# Patient Record
Sex: Male | Born: 1955
Health system: Southern US, Community
[De-identification: ages and names within clinical notes are randomized; demographics above are authoritative.]

## PROBLEM LIST (undated history)

## (undated) DIAGNOSIS — E7800 Pure hypercholesterolemia, unspecified: Secondary | ICD-10-CM

## (undated) DIAGNOSIS — Z970 Presence of artificial eye: Secondary | ICD-10-CM

## (undated) DIAGNOSIS — I10 Essential (primary) hypertension: Secondary | ICD-10-CM

## (undated) HISTORY — DX: Presence of artificial eye: Z97.0

---
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
## 2006-03-23 ENCOUNTER — Ambulatory Visit: Payer: Self-pay | Admitting: Gastroenterology

## 2006-03-30 ENCOUNTER — Ambulatory Visit (HOSPITAL_COMMUNITY): Admission: RE | Admit: 2006-03-30 | Discharge: 2006-03-30 | Payer: Self-pay | Admitting: Gastroenterology

## 2006-03-30 ENCOUNTER — Ambulatory Visit: Payer: Self-pay | Admitting: Gastroenterology

## 2006-03-30 ENCOUNTER — Encounter (INDEPENDENT_AMBULATORY_CARE_PROVIDER_SITE_OTHER): Payer: Self-pay | Admitting: Specialist

## 2008-07-02 ENCOUNTER — Emergency Department (HOSPITAL_COMMUNITY): Admission: EM | Admit: 2008-07-02 | Discharge: 2008-07-02 | Payer: Self-pay | Admitting: Emergency Medicine

## 2010-09-30 LAB — BASIC METABOLIC PANEL
CO2: 25 mEq/L (ref 19–32)
Calcium: 9.1 mg/dL (ref 8.4–10.5)
Chloride: 107 mEq/L (ref 96–112)
GFR calc Af Amer: >60 mL/min (ref >60)
Potassium: 4.5 mEq/L (ref 3.5–5.1)
Sodium: 138 mEq/L (ref 135–145)

## 2010-09-30 LAB — CBC
Hemoglobin: 14.1 g/dL (ref 13.0–17.0)
MCHC: 32.2 g/dL (ref 30.0–36.0)
RBC: 6.02 MIL/uL — ABNORMAL HIGH (ref 4.22–5.81)

## 2010-09-30 LAB — POCT CARDIAC MARKERS
CKMB, poc: <1 ng/mL — ABNORMAL LOW (ref 1.0–8.0)
CKMB, poc: <1 ng/mL — ABNORMAL LOW (ref 1.0–8.0)
Myoglobin, poc: 96.8 ng/mL (ref 12–200)
Troponin i, poc: <0.05 ng/mL (ref 0.00–0.09)

## 2010-09-30 LAB — DIFFERENTIAL
Basophils Relative: 0 % (ref 0–1)
Monocytes Absolute: 0.2 10*3/uL (ref 0.1–1.0)
Monocytes Relative: 4 % (ref 3–12)
Neutro Abs: 4.9 10*3/uL (ref 1.7–7.7)

## 2010-11-01 NOTE — Op Note (Signed)
Spencer Carter, Spencer Carter                   ACCOUNT NO.:  [id number]   MEDICAL RECORD NO.:  [id number]          PATIENT TYPE:  AMB   LOCATION:  DAY                           FACILITY:  [facility]   PHYSICIAN:  Kassie Mends, M.D.      DATE OF BIRTH:  [birth year]   DATE OF PROCEDURE:  03/30/2006  DATE OF DISCHARGE:                                 OPERATIVE REPORT   PROCEDURE:  Colonoscopy with cold forceps polypectomy and snare polypectomy.   REFERRING PHYSICIAN:  Assunta Found, MD.   INDICATION FOR EXAM:  Mr. Mault is a 55 year old male who presented with  bloating.  He also presents for average risk colon cancer screening.   FINDINGS:  1. 6 mm pedunculated transverse colon polyp removed via snare cautery      (200/25).  2. 3 mm sessile rectal polyp removed via cold forceps.  3. Otherwise no masses, inflammatory changes or diverticula evident.  4. Normal retroflex view of the rectum.   RECOMMENDATIONS:  1. If polyps adenomatous, then next screening colonoscopy in five years.      If polyps are adenomatous, then his siblings and children should begin      colon cancer screening at age 3, and then have one every 5 years  2. No aspirin or anti-inflammatory drugs or anticoagulation for seven      days.  3. Will call with biopsy results.  4. Follow up with Dr. Kassie Mends in two months for bloating.  5. Handout given on high-fiber diet and polyps.   MEDICATIONS:  1. Demerol 100 mg IV.  2. Versed 5 mg IV.   PROCEDURE TECHNIQUE:  Physical exam was performed and informed consent was  obtained from the patient after explaining the benefits, risks and  alternatives to the procedure.  The patient was connected to the monitor and  placed in left lateral position.  Continuous oxygen was provided by nasal  cannula and IV medicine administered through an indwelling cannula.  After  administration of sedation and rectal exam, the patient's rectum was  intubated.  The scope was passed under direct  visualization to the cecum.  The scope was  subsequently moved slowly by carefully examining color, texture, anatomy and  integrity of the mucosa on the way out.  The patient was recovered in  endoscopy suite discharged home in satisfactory condition.      Kassie Mends, M.D.  Electronically Signed     SM/MEDQ  D:  03/31/2006  T:  04/01/2006  Job:  [id number]   cc:   Corrie Mckusick, M.D.  Fax: [phone]

## 2010-11-01 NOTE — Consult Note (Signed)
NAMEREBECCA, Spencer Carter                   ACCOUNT NO.:  [id number]   MEDICAL RECORD NO.:  [id number]          PATIENT TYPE:  AMB   LOCATION:                                FACILITY:  [facility]   PHYSICIAN:  Kassie Mends, M.D.      DATE OF BIRTH:  06-08-1956   DATE OF CONSULTATION:  03/23/2006  DATE OF DISCHARGE:                                   CONSULTATION   REFERRING PHYSICIAN:  Corrie Mckusick, M.D.   REASON FOR CONSULTATION:  Abdominal bloating.   HISTORY OF PRESENT ILLNESS:  Spencer Carter is a 55 year old male who has had  bloating and gas which have been moderate for the past month. It is not  related to any particular foods. He does consume milk with cereal and during  the day chews two to three pieces of sugar-free gum. He has 1 to 2 formed  bowel movements a day. He may also have cheese on his eggs in the morning.  He denies any consumption of peppermint, wintergreen or spearmint candy. He  can be gassy at times. He has been burping a lot for the last couple of  months. He denies any change in his diet. He occasionally has a Dr. Reino Kent.  He drinks 1 to 2 glasses of sweet tea a day. Primarily drinks water. He  denies any nausea, vomiting, difficulty swallowing, weight loss, black tarry  stools or indigestion. He has heartburn once a week and uses omeprazole as  needed for that. He has lower abdominal pain and bloating. He may experience  some upper quadrant pain as well. He denies any weight loss.   PAST MEDICAL HISTORY:  1. Hypertension.  2. Sciatica.   PAST SURGICAL HISTORY:  Eye surgery.   ALLERGIES:  No known drug allergies.   MEDICATIONS:  1. Omeprazole 20 mg 1 or 2 times a day as needed.  2. Hydrocodone/APAP 5/500 one every 4 to 6 hours as needed.  3. Hydrochlorothiazide 1 daily, currently not using.   FAMILY HISTORY:  He has no family history of colon cancer or colon polyps.   SOCIAL HISTORY:  He is married and has two children, ages 53 and 48. 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 He denies any tobacco or alcohol  use.   REVIEW OF SYSTEMS:  Per the HPI. Otherwise, all systems are negative.   PHYSICAL EXAMINATION:  Weight was 168.5 pounds, height 5 foot 8 inches, BMI  25.5 (slightly overweight). Temperature 98.1, blood pressure 128/84, pulse  84.  GENERAL:  He is in no apparent distress, alert and oriented x4. He is in no  apparent distress.  HEENT:  Exam is atraumatic and normocephalic. Pupils are equal, round, and  reactive to light. Mouth:  No oral lesions. Posterior pharynx without  erythema or exudate.  NECK:  Full range of motion. No lymphadenopathy.  LUNGS:  Clear to auscultation bilaterally.  CARDIOVASCULAR EXAM:  Regular rate and rhythm. No murmur. Normal S1 and S2.  ABDOMEN:  Bowel sounds are present. Soft, nontender, nondistended. No  rebound or guarding.  No hepatosplenomegaly.  EXTREMITIES:  Without cyanosis, clubbing or edema.  NEUROLOGICAL:  He has no focal neurologic deficits.   LABORATORY DATA:  December 2006:  White count 4.6, hemoglobin 15.6,  platelets 268, total bilirubin 1.3, alkaline phosphatase 66, AST 26, ALT 22,  albumin 4.7, TSH 3.803; March 2007:  Creatinine 1.4, total bilirubin 0.7,  alkaline phosphatase 61, AST 24, ALT 21, albumin 3.8.   ASSESSMENT:  The patient is a 55 year old male with abdominal bloating and  associated discomfort which is likely secondary to food intolerance. He has  a low likelihood of having irritable bowel or colon malignancy. He is  average risk for developing colon cancer. Thank you for allowing me to see  the patient in consultation. My recommendations follow.   RECOMMENDATIONS:  1. He should avoid lactose. He is given a list of foods that may cause      bloating and gas production in the colon.  2. He will be scheduled for colonoscopy the week of the 15th of October.      He will use the OsmoPrep.  3. He will follow with me as needed.   Please feel free to contact me, [phone],  with additional questions.      Kassie Mends, M.D.  Electronically Signed     SM/MEDQ  D:  03/23/2006  T:  03/25/2006  Job:  [id number]   cc:   Corrie Mckusick, M.D.  Fax: [phone]

## 2011-03-05 ENCOUNTER — Encounter: Payer: Self-pay | Admitting: Gastroenterology

## 2011-03-11 ENCOUNTER — Encounter: Payer: Self-pay | Admitting: Gastroenterology

## 2015-04-18 ENCOUNTER — Other Ambulatory Visit (HOSPITAL_COMMUNITY): Payer: Self-pay | Admitting: Internal Medicine

## 2015-04-27 ENCOUNTER — Other Ambulatory Visit (HOSPITAL_COMMUNITY): Payer: Self-pay | Admitting: Hematology & Oncology

## 2015-04-27 DIAGNOSIS — R590 Localized enlarged lymph nodes: Secondary | ICD-10-CM | POA: Insufficient documentation

## 2015-05-01 ENCOUNTER — Encounter (HOSPITAL_COMMUNITY): Payer: 59 | Attending: Oncology

## 2015-05-01 DIAGNOSIS — R599 Enlarged lymph nodes, unspecified: Secondary | ICD-10-CM | POA: Insufficient documentation

## 2015-05-01 DIAGNOSIS — Z0000 Encounter for general adult medical examination without abnormal findings: Secondary | ICD-10-CM | POA: Diagnosis not present

## 2015-05-01 DIAGNOSIS — R590 Localized enlarged lymph nodes: Secondary | ICD-10-CM | POA: Diagnosis not present

## 2015-05-01 DIAGNOSIS — K7689 Other specified diseases of liver: Secondary | ICD-10-CM | POA: Insufficient documentation

## 2015-05-01 LAB — COMPREHENSIVE METABOLIC PANEL
ALT: 17 U/L (ref 17–63)
ANION GAP: 5 (ref 5–15)
AST: 21 U/L (ref 15–41)
Albumin: 3.8 g/dL (ref 3.5–5.0)
Alkaline Phosphatase: 69 U/L (ref 38–126)
BUN: 23 mg/dL — ABNORMAL HIGH (ref 6–20)
CHLORIDE: 109 mmol/L (ref 101–111)
CO2: 26 mmol/L (ref 22–32)
CREATININE: 1.31 mg/dL — AB (ref 0.61–1.24)
Calcium: 8.8 mg/dL — ABNORMAL LOW (ref 8.9–10.3)
GFR calc non Af Amer: 58 mL/min — ABNORMAL LOW (ref >60)
Glucose, Bld: 96 mg/dL (ref 65–99)
Potassium: 4.1 mmol/L (ref 3.5–5.1)
SODIUM: 140 mmol/L (ref 135–145)
Total Bilirubin: 0.7 mg/dL (ref 0.3–1.2)
Total Protein: 7.2 g/dL (ref 6.5–8.1)

## 2015-05-01 LAB — CBC WITH DIFFERENTIAL/PLATELET
BASOS PCT: 1 %
Basophils Absolute: 0 10*3/uL (ref 0.0–0.1)
EOS ABS: 0.1 10*3/uL (ref 0.0–0.7)
EOS PCT: 4 %
HCT: 40.9 % (ref 39.0–52.0)
Hemoglobin: 12.8 g/dL — ABNORMAL LOW (ref 13.0–17.0)
LYMPHS ABS: 1.7 10*3/uL (ref 0.7–4.0)
Lymphocytes Relative: 47 %
MCH: 23.4 pg — AB (ref 26.0–34.0)
MCHC: 31.3 g/dL (ref 30.0–36.0)
MCV: 74.6 fL — ABNORMAL LOW (ref 78.0–100.0)
Monocytes Absolute: 0.4 10*3/uL (ref 0.1–1.0)
Monocytes Relative: 10 %
Neutro Abs: 1.4 10*3/uL — ABNORMAL LOW (ref 1.7–7.7)
Neutrophils Relative %: 38 %
PLATELETS: 190 10*3/uL (ref 150–400)
RBC: 5.48 MIL/uL (ref 4.22–5.81)
RDW: 15.5 % (ref 11.5–15.5)
WBC: 3.6 10*3/uL — AB (ref 4.0–10.5)

## 2015-05-01 LAB — LACTATE DEHYDROGENASE: LDH: 143 U/L (ref 98–192)

## 2015-05-01 LAB — SEDIMENTATION RATE: SED RATE: 10 mm/h (ref 0–16)

## 2015-05-01 NOTE — Progress Notes (Signed)
LABS DRAWN

## 2015-05-02 ENCOUNTER — Ambulatory Visit
Admission: RE | Admit: 2015-05-02 | Discharge: 2015-05-02 | Disposition: A | Discharge status: Home / Self Care | Payer: 59 | Source: Ambulatory Visit | Attending: Hematology & Oncology | Admitting: Hematology & Oncology

## 2015-05-02 DIAGNOSIS — R590 Localized enlarged lymph nodes: Secondary | ICD-10-CM | POA: Insufficient documentation

## 2015-05-02 DIAGNOSIS — Z0189 Encounter for other specified special examinations: Secondary | ICD-10-CM | POA: Insufficient documentation

## 2015-05-02 DIAGNOSIS — R599 Enlarged lymph nodes, unspecified: Secondary | ICD-10-CM | POA: Diagnosis present

## 2015-05-02 LAB — BETA 2 MICROGLOBULIN, SERUM: Beta-2 Microglobulin: 1.7 mg/L (ref 0.6–2.4)

## 2015-05-02 LAB — GLUCOSE, CAPILLARY: Glucose-Capillary: 79 mg/dL (ref 65–99)

## 2015-05-02 MED ORDER — FLUDEOXYGLUCOSE F - 18 (FDG) INJECTION
12.7100 | Freq: Once | INTRAVENOUS | Status: DC | PRN
  Administered 2015-05-02: 12.71 via INTRAVENOUS
  Filled 2015-05-02: qty 12.71

## 2015-05-09 ENCOUNTER — Encounter (HOSPITAL_BASED_OUTPATIENT_CLINIC_OR_DEPARTMENT_OTHER): Payer: 59 | Admitting: Hematology & Oncology

## 2015-05-09 ENCOUNTER — Encounter (HOSPITAL_COMMUNITY): Payer: Self-pay | Admitting: Hematology & Oncology

## 2015-05-09 ENCOUNTER — Encounter (HOSPITAL_BASED_OUTPATIENT_CLINIC_OR_DEPARTMENT_OTHER): Payer: 59

## 2015-05-09 VITALS — BP 140/90 | HR 74 | Temp 98.1°F | Resp 14 | Ht 66.5 in | Wt 176.0 lb

## 2015-05-09 DIAGNOSIS — R932 Abnormal findings on diagnostic imaging of liver and biliary tract: Secondary | ICD-10-CM | POA: Diagnosis not present

## 2015-05-09 DIAGNOSIS — K7689 Other specified diseases of liver: Secondary | ICD-10-CM | POA: Diagnosis not present

## 2015-05-09 DIAGNOSIS — K769 Liver disease, unspecified: Secondary | ICD-10-CM

## 2015-05-09 DIAGNOSIS — R590 Localized enlarged lymph nodes: Secondary | ICD-10-CM

## 2015-05-09 DIAGNOSIS — R599 Enlarged lymph nodes, unspecified: Secondary | ICD-10-CM

## 2015-05-09 DIAGNOSIS — Z0000 Encounter for general adult medical examination without abnormal findings: Secondary | ICD-10-CM

## 2015-05-09 DIAGNOSIS — Z23 Encounter for immunization: Secondary | ICD-10-CM | POA: Diagnosis not present

## 2015-05-09 MED ORDER — INFLUENZA VAC SPLIT QUAD 0.5 ML IM SUSY
0.5000 mL | PREFILLED_SYRINGE | Freq: Once | INTRAMUSCULAR | Status: AC
  Administered 2015-05-09: 0.5 mL via INTRAMUSCULAR

## 2015-05-09 MED ORDER — INFLUENZA VAC SPLIT QUAD 0.5 ML IM SUSY
PREFILLED_SYRINGE | INTRAMUSCULAR | Status: AC
  Filled 2015-05-09: qty 0.5

## 2015-05-09 NOTE — Progress Notes (Signed)
Spencer Carter Had flu vaccine today

## 2015-05-09 NOTE — Progress Notes (Signed)
LABS DRAWN

## 2015-05-09 NOTE — Patient Instructions (Addendum)
[place] at [facility] Discharge Instructions  RECOMMENDATIONS MADE BY THE CONSULTANT AND ANY TEST RESULTS WILL BE SENT TO YOUR REFERRING PHYSICIAN.   Exam completed by Dr Whitney Muse today Lab work today Flu vaccine given today We will be in touch with you Monday about what the radiologist has said about your PET scan. MRI liver scheduled. Return to see the doctor after your MRI.  We will schedule that for you. Please call the clinic if you have any questions or concerns   Thank you for choosing [place] at [facility] to provide your oncology and hematology care.  To afford each patient quality time with our provider, please arrive at least 15 minutes before your scheduled appointment time.    You need to re-schedule your appointment should you arrive 10 or more minutes late.  We strive to give you quality time with our providers, and arriving late affects you and other patients whose appointments are after yours.  Also, if you no show three or more times for appointments you may be dismissed from the clinic at the providers discretion.     Again, thank you for choosing [facility].  Our hope is that these requests will decrease the amount of time that you wait before being seen by our physicians.       _____________________________________________________________  Should you have questions after your visit to [facility], please contact our office at [phone] between the hours of 8:30 a.m. and 4:30 p.m.  Voicemails left after 4:30 p.m. will not be returned until the following business day.  For prescription refill requests, have your pharmacy contact our office.

## 2015-05-09 NOTE — Progress Notes (Signed)
[place] at [place] NOTE  Patient Care Team: Redmond School, MD as PCP - General (Internal Medicine)  CHIEF COMPLAINTS/PURPOSE OF CONSULTATION:  CT A/P on 04/20/2015 at [place] with 3 low density lesions within the liver, R retrocrural, L para-aortic, bilater inguinal LAD greates on the L, these findings may represent lymphoma.  PET/CT 05/02/2015 with hypermetabolic LAD in the common femoral region and L groin area. Upper normal L para-aortic LN in the abdomen shows borderline FDG uptake.   HISTORY OF PRESENTING ILLNESS:  Spencer Carter 59 y.o. male is here because of abnormal imaging of the abdomen and liver.   Spencer Carter is here on referral from Dr. Gerarda Fraction. He is here today with his wife and grandson. He states that he is feeling fine altogether.  He notes that he had an egg-sized swelling in his left groin. According to his wife, Dr. Gerarda Fraction stated that it was likely an "inflamed gland." Spencer Carter was prescribed an antibiotic and it "went down," but he reports that it is "still there."  Other than this he has experienced no weight loss, no chest pain, no pain while breathing, and he continues to have a good appetite.  He does state that he has been a little constpiated ever since he took the antibiotic.  He last had a colonoscopy with Dr. Oneida Alar about 10 years ago; he states that he thinks it was normal, with maybe 1 or 2 polyps.   He has experienced no pain anywhere other than the "groin lump."  He denies any urine change, blood, or pain while urinating, and denies any blood in his stool. He also denies any drenching night sweats.  He does say that sometimes there is a ain in his right side when he wakes up int he morning, and sometimes a pain in his left arm.  He goes to see an eye doctor once a year and has a prosthetic eye due to an accident when he as about 67.  His wife also reports a new swelling over his left anterior hip, stating "it's not hard, but it  wasn't there until a couple of weeks ago." She adds that it's probably been there about three weeks, since before he did the scan in [place]. 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 She states that she believes the "straining did it."  Physically, his wife reports that Spencer Carter has not slowed down at all, but she says she has noticed that he may get more tired more easily now, and that he falls asleep perhaps a bit more easily as well.  He does work 12 hours a day, 6-7 days a week, working weekends too, so this could contribute to his exhaustion. Spencer Carter confirms that he is often a bit tired, probably due to work.  When asked if he takes vacation, Spencer Carter stated "what is that?" But he and his wife confirm that they go to the beach about twice a year, whereupon he does relax and barely does anything.  He is here today to review recent imaging studies and for additional recommendations.    MEDICAL HISTORY:  Past Medical History  Diagnosis Date  . Prosthetic eye globe     right eye    SURGICAL HISTORY: No past surgical history on file.  SOCIAL HISTORY: Social History   Social History  . Marital  Status: Married    Spouse Name: N/A  . Number of Children: N/A  . Years of Education: N/A   Occupational History  . Not on file.   Social History Main Topics  . Smoking status: Never Smoker   . Smokeless tobacco: Not on file  . Alcohol Use: No  . Drug Use: Not on file  . Sexual Activity: Not on file   Other Topics Concern  . Not on file   Social History Narrative  . No narrative on file   He's been married 40 years, with his wife for 43 years. 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 Heart disease Mother   . Cancer Sister    Mother and father are both deceased Mother died at 79, healthy for most of her life but died of heart disease and on dialysis Father died at age 33 of asbestos exposure at work Had 7 brothers and sisters Twin brother died of a heart attack at 56 2 other brothers still living One of his sisters had breast cancer, she's still living; "divine lemonade"  indicated that his mother is deceased. He indicated that his father is deceased. He indicated that his sister is alive.   ALLERGIES:  has no allergies on file.  MEDICATIONS:  Current Outpatient Prescriptions  Medication Sig Dispense Refill  . losartan (COZAAR) 100 MG tablet Take 100 mg by mouth daily.  0  . fluticasone (FLONASE) 50 MCG/ACT nasal spray Place 1 spray into both nostrils as needed.  1   No current facility-administered medications for this visit.    Review of Systems  Constitutional: Positive for malaise/fatigue. Negative for fever, chills and weight loss.       Fatigue described as minimal  HENT: Negative.  Negative for congestion, hearing loss, nosebleeds, sore throat and tinnitus.   Eyes: Negative.  Negative for blurred vision, double vision, pain and discharge.  Respiratory: Negative.  Negative for cough, hemoptysis, sputum production, shortness of breath and wheezing.   Cardiovascular: Negative.  Negative for chest pain, palpitations, claudication, leg swelling and PND.  Gastrointestinal: Negative.  Negative for heartburn, nausea, vomiting, abdominal pain, diarrhea, constipation, blood in stool and melena.  Genitourinary: Negative.  Negative for dysuria, urgency, frequency and hematuria.  Musculoskeletal: Negative.  Negative for myalgias, joint pain and falls.       Reports experiencing intermittent pain in his right side and left arm, maybe due to "how he sleeps."  Skin: Negative.  Negative for itching and rash.   Neurological: Positive for tingling. Negative for dizziness, tremors, sensory change, speech change, focal weakness, seizures, loss of consciousness, weakness and headaches.  Endo/Heme/Allergies: Negative.  Does not bruise/bleed easily.  Psychiatric/Behavioral: Negative.  Negative for depression, suicidal ideas, memory loss and substance abuse. The patient is not nervous/anxious and does not have insomnia.   All other systems reviewed and are negative.  14 point ROS was done and is otherwise as detailed above or in HPI   PHYSICAL EXAMINATION: ECOG PERFORMANCE STATUS: 0 - Asymptomatic  Filed Vitals:   05/09/15 0806 05/09/15 0900  BP: 161/100 140/90  Pulse: 77 74  Temp: 98.1 F (36.7 C)   Resp: 14    Filed Weights   05/09/15 0806  Weight: 176 lb (79.833 kg)  Physical Exam  Constitutional: He is oriented to person, place, and time and well-developed, well-nourished, and in no distress.  Left axillar adenopathy; doesn't show up on PET scan  HENT:  Head: Normocephalic and atraumatic.  Nose: Nose normal.  Mouth/Throat: Oropharynx is clear and moist. No oropharyngeal exudate.  Eyes: Conjunctivae and EOM are normal. Pupils are equal, round, and reactive to light. Right eye exhibits no discharge. Left eye exhibits no discharge. No scleral icterus.  One prosthetic eye  Neck: Normal range of motion. Neck supple. No tracheal deviation present. No thyromegaly present.  Cardiovascular: Normal rate, regular rhythm and normal heart sounds.  Exam reveals no gallop and no friction rub.   No murmur heard. Pulmonary/Chest: Effort normal and breath sounds normal. He has no wheezes. He has no rales.  Abdominal: Soft. Bowel sounds are normal. He exhibits no distension and no mass. There is no tenderness. There is no rebound and no guarding.  Swollen, palpable, consistency of lipoma in the left anterior hip. Patient reports no pain when it is palpated  Genitourinary:  Left groin LAD still  palpable,mobile,  but patient reports no pain when palpated.  Musculoskeletal: Normal range of motion. He exhibits no edema.  Lymphadenopathy:    He has no cervical adenopathy.  Neurological: He is alert and oriented to person, place, and time. He has normal reflexes. No cranial nerve deficit. Gait normal. Coordination normal. GCS score is 15.  Skin: Skin is warm and dry. No rash noted.  Psychiatric: Mood, memory, affect and judgment normal.  Nursing note and vitals reviewed.   LABORATORY DATA:  I have reviewed the data as listed Lab Results  Component Value Date   WBC 3.6* 05/01/2015   HGB 12.8* 05/01/2015   HCT 40.9 05/01/2015   MCV 74.6* 05/01/2015   PLT 190 05/01/2015   CMP     Component Value Date/Time   NA 140 05/01/2015 0902   K 4.1 05/01/2015 0902   CL 109 05/01/2015 0902   CO2 26 05/01/2015 0902   GLUCOSE 96 05/01/2015 0902   BUN 23* 05/01/2015 0902   CREATININE 1.31* 05/01/2015 0902   CALCIUM 8.8* 05/01/2015 0902   PROT 7.2 05/01/2015 0902   ALBUMIN 3.8 05/01/2015 0902   AST 21 05/01/2015 0902   ALT 17 05/01/2015 0902   ALKPHOS 69 05/01/2015 0902   BILITOT 0.7 05/01/2015 0902   GFRNONAA 58* 05/01/2015 0902   GFRAA >60 05/01/2015 0902     RADIOGRAPHIC STUDIES: I have personally reviewed the radiological images as listed and agreed with the findings in the report. CLINICAL DATA: Subsequent treatment strategy for retrocrural and para-aortic lymphadenopathy.  EXAM: NUCLEAR MEDICINE PET SKULL BASE TO THIGH  TECHNIQUE: 12.7 mCi F-18 FDG was injected intravenously. Full-ring PET imaging was performed from the skull base to thigh after the radiotracer. CT data was obtained and used for attenuation correction and anatomic localization.  FASTING BLOOD GLUCOSE: Value: 79 mg/dl  COMPARISON: None.  FINDINGS: NECK are with sat  No evidence for hypermetabolic lymphadenopathy.  CHEST  No hypermetabolic mediastinal or hilar nodes. No  suspicious pulmonary nodules on the CT scan.  ABDOMEN/PELVIS  No hypermetabolic lesions in the liver or spleen although the patient does have a 4.7 cm hypo attenuating lesion in the anterior right liver in a 3.2 cm hypo attenuating lesion in the dome of the liver.  No evidence for hypermetabolic lymphadenopathy in the abdomen. 8 mm short axis left para-aortic lymph node on image 161 series 3 shows borderline FDG  uptake with SUV max = 2.3 .  1.7 x 2.5 cm left groin lymph node is hypermetabolic with SUV max = 7.1. Other small lymph nodes in the left groin region show hypermetabolism. 13 mm short axis left common femoral lymph node seen on image 2 ordered 18 series 3 demonstrates SUV max = 6.0  There is abdominal aortic atherosclerosis without aneurysm.  SKELETON  No focal hypermetabolic activity to suggest skeletal metastasis.  IMPRESSION: 1. Hypermetabolic lymphadenopathy in the common femoral region and left groin area. Upper normal left para-aortic lymph node in the abdomen shows borderline FDG uptake.  Electronically Signed: By: Misty Stanley M.D. On: 05/02/2015 11:53  ADDENDUM REPORT: 05/02/2015 12:06 ADDENDUM: Large hepatic lesions without associated hypermetabolism. MRI without and with contrast would prove helpful to further evaluate. Electronically Signed  By: Misty Stanley M.D.  On: 05/02/2015 12:06   ASSESSMENT & PLAN:  CT A/P on 04/20/2015 at [place] with 3 low density lesions within the liver, R retrocrural, L para-aortic, bilater inguinal LAD greates on the L, these findings may represent lymphoma. PET/CT 05/02/2015 with hypermetabolic LAD in the common femoral region and L groin area. Upper normal L para-aortic LN in the abdomen shows borderline FDG uptake. Anemia CKD Lymphadenopathy  Several ways to proceed following his PET scan were discussed. I have recommended an MRI of the liver for better characterization of the liver lesions.  The  negative PET hypermetabolism is reassuring however I did explain to the patient and his wife that indolent lymphomas can be PET negative.  I will contact Dr. Laurence Ferrari for his thoughts on Mr. Fadley CT and PET scans, and how we should proceed regarding a biopsy. If a general surgeon is needed for a general biopsy, I will speak with Dr. Arnoldo Morale.  Due to some indications from his wife, Spencer Carter may also need a sleep study at some point in the future.  I will call Spencer Carter on Monday to discuss what radiologist has to say.  We will see him back after his MRI.  No problem-specific assessment & plan notes found for this encounter.  Orders Placed This Encounter  Procedures  . MR Abdomen W Wo Contrast    Standing Status: Future     Number of Occurrences:      Standing Expiration Date: 05/08/2016    Order Specific Question:  If indicated for the ordered procedure, I authorize the administration of contrast media per Radiology protocol    Answer:  Yes    Order Specific Question:  Reason for Exam (SYMPTOM  OR DIAGNOSIS REQUIRED)    Answer:  abnormal imaging with multiple hepatic lesions, not PET avid    Order Specific Question:  Preferred imaging location?    Answer:  [facility]    Order Specific Question:  Does the patient have a pacemaker or implanted devices?    Answer:  No    Order Specific Question:  What is the patient's sedation requirement?    Answer:  No Sedation  . Chromogranin A  . Serotonin serum  . CEA   All questions were answered. The patient knows to call the clinic with any problems, questions or concerns.  This document serves as a record of services personally performed by Ancil Linsey, MD. It was created on her behalf by Toni Amend, a trained medical scribe. The creation of this record is based on the scribe's personal observations and the provider's statements to them. This document has been checked and approved by the attending provider.  I have  reviewed the above documentation for accuracy and completeness, and I agree with the above.  This note was electronically signed.  Molli Hazard, MD  05/09/2015 9:11 AM

## 2015-05-10 LAB — CEA: CEA: 1 ng/mL (ref 0.0–4.7)

## 2015-05-13 LAB — SEROTONIN SERUM: Serotonin, Serum: 96 ng/mL (ref 21–321)

## 2015-05-14 LAB — CHROMOGRANIN A: CHROMOGRANIN A: 2 nmol/L (ref 0–5)

## 2015-05-24 ENCOUNTER — Ambulatory Visit (HOSPITAL_COMMUNITY): Payer: 59

## 2015-05-25 ENCOUNTER — Ambulatory Visit (HOSPITAL_COMMUNITY): Payer: 59 | Admitting: Hematology & Oncology

## 2015-05-25 NOTE — Progress Notes (Signed)
This encounter was created in error - please disregard.

## 2015-06-24 ENCOUNTER — Encounter (HOSPITAL_COMMUNITY): Payer: Self-pay | Admitting: Hematology & Oncology

## 2015-08-30 ENCOUNTER — Telehealth: Payer: Self-pay | Admitting: Gastroenterology

## 2015-08-30 NOTE — Telephone Encounter (Signed)
Letter mailed to pt.  

## 2015-08-30 NOTE — Telephone Encounter (Signed)
PATIENT ON RECALL FOR TCS °

## 2016-03-07 IMAGING — PT NM PET TUM IMG INITIAL (PI) SKULL BASE T - THIGH
10 series · 25 of 25 positions shown · IV contrast (agent unspecified)
Comparison: None.

ADDENDUM:
Large hepatic lesions without associated hypermetabolism. MRI
without and with contrast would prove helpful to further evaluate.
CLINICAL DATA: Subsequent treatment strategy for retrocrural and
para-aortic lymphadenopathy..

EXAM:
NUCLEAR MEDICINE PET SKULL BASE TO THIGH
TECHNIQUE: 12.7 mCi F-18 FDG was injected intravenously. Full-ring PET imaging
was performed from the skull base to thigh after the radiotracer. CT
data was obtained and used for attenuation correction and anatomic
localization.
FASTING BLOOD GLUCOSE:  Value: 79 mg/dl

[Series 3: ct wb 5.0 b30f · axial · 5.0mm · 0.98mm/px · z∈[-1464,-598]mm · 3 of 290 slices shown]
[im 1/290]
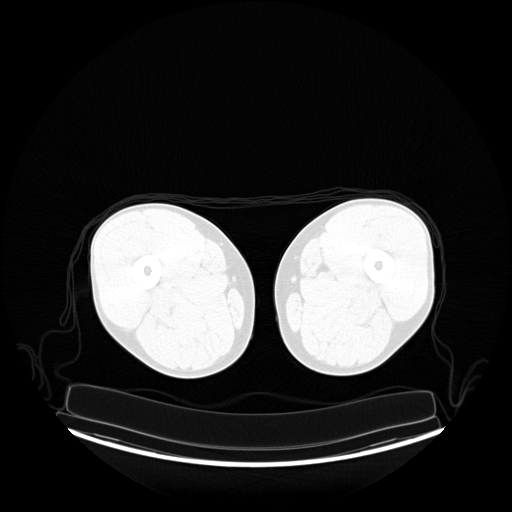
[im 145/290]
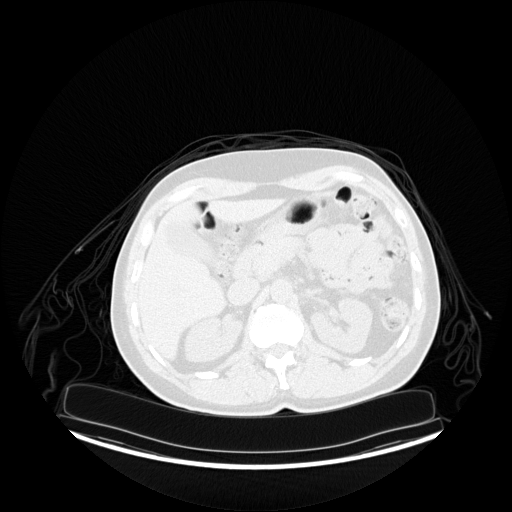
[im 290/290  brain]
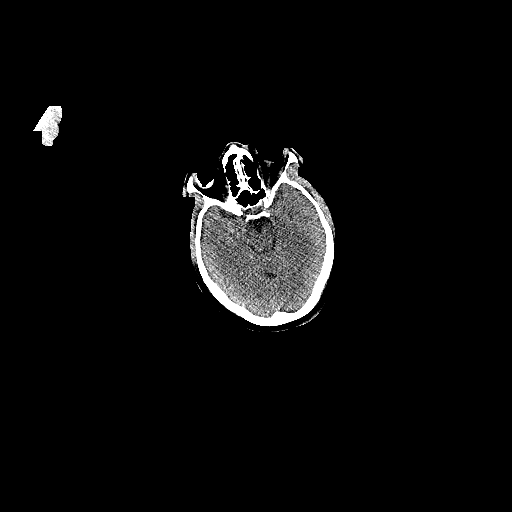

[Series 4: pet wb (ac) · axial · 5.0mm · 4.07mm/px · z∈[-1464,-598]mm · 3 of 290 slices shown]
[im 1/290]
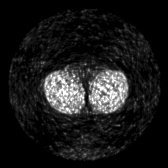
[im 145/290]
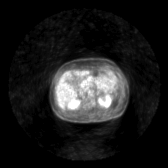
[im 290/290]
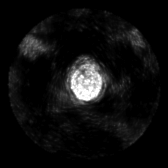

[Series 5: pet wb uncorrected (nac) · axial · 5.0mm · 4.07mm/px · z∈[-1464,-598]mm · 4 of 290 slices shown]
[im 1/290]
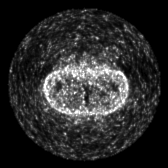
[im 97/290]
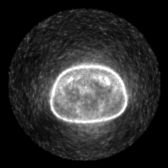
[im 193/290]
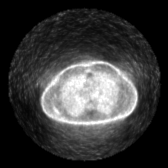
[im 290/290]
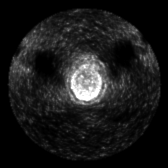

[Series 603: pet axial fused · 3 of 280 slices shown]
[im 1/280]
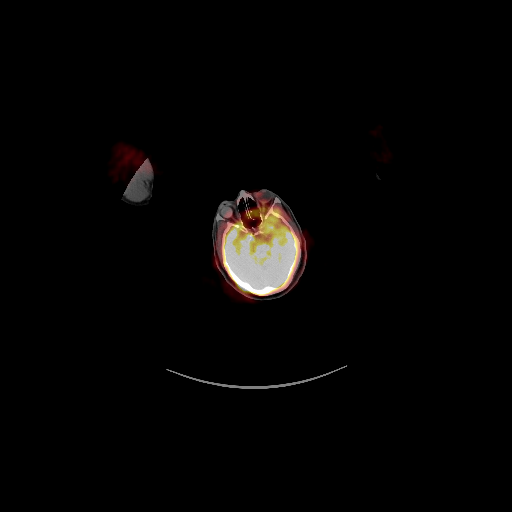
[im 140/280]
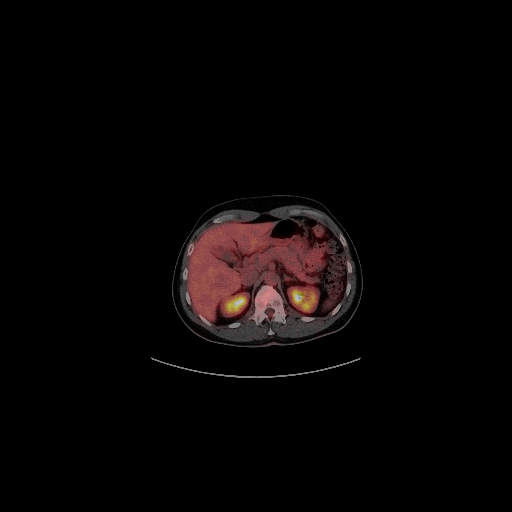
[im 280/280]
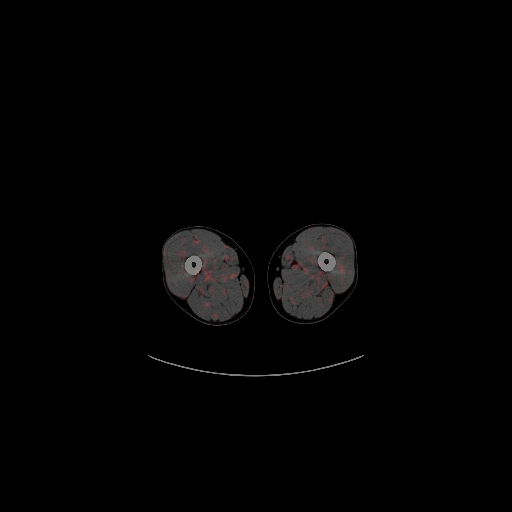

[Series 604: pet coronal fused · 1 of 111 slices shown]
[im 1/111]
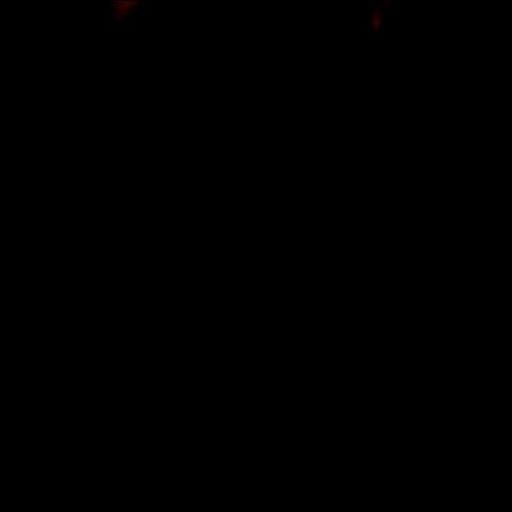

[Series 605: pet sagittal fused · 2 of 155 slices shown]
[im 1/155]
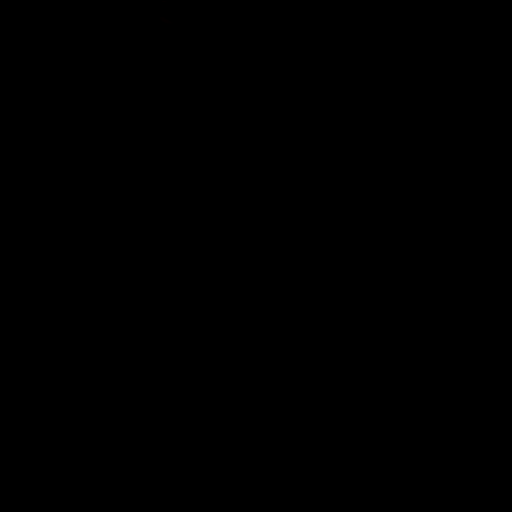
[im 155/155]
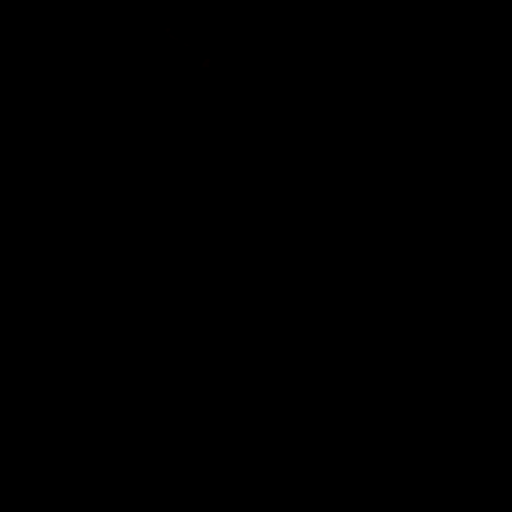

[Series 606: pet axial · 4 of 288 slices shown]
[im 1/288]
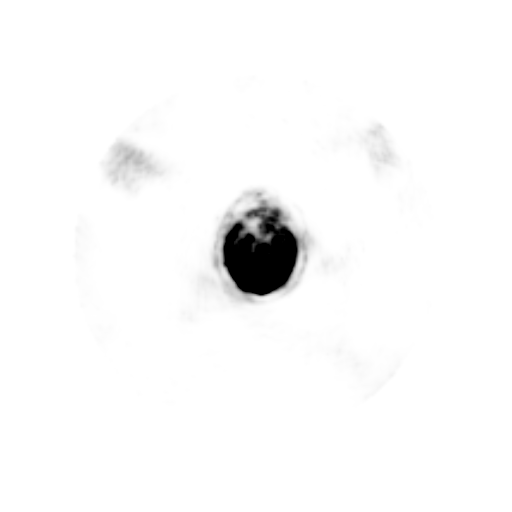
[im 96/288]
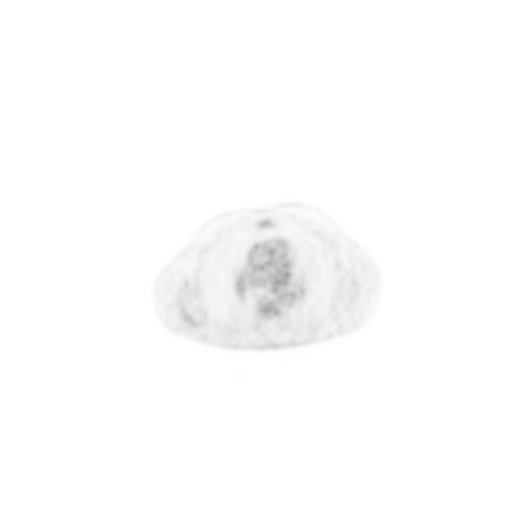
[im 192/288]
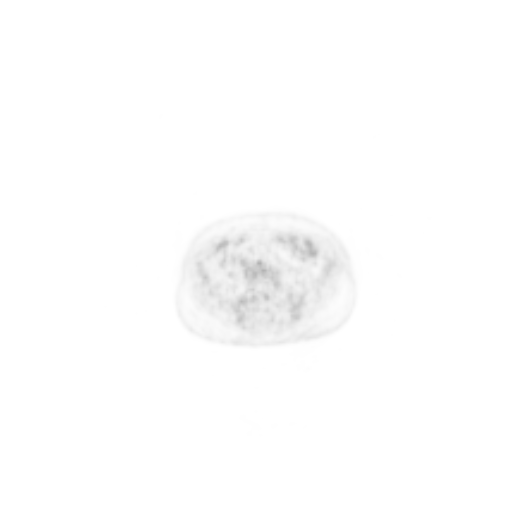
[im 288/288]
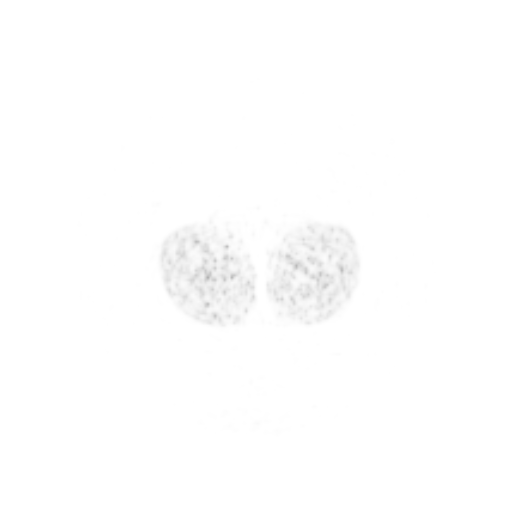

[Series 607: pet coronal · 2 of 130 slices shown]
[im 1/130]
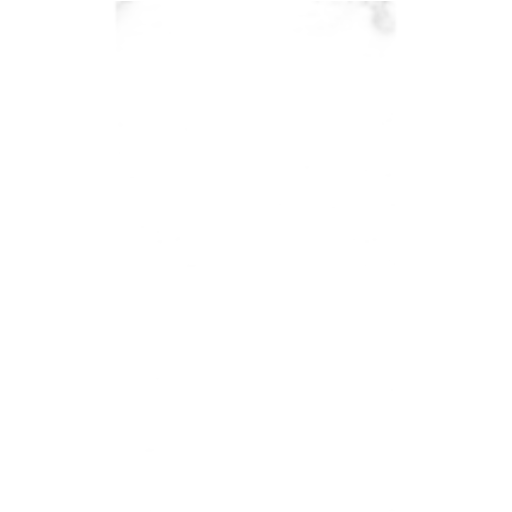
[im 130/130]
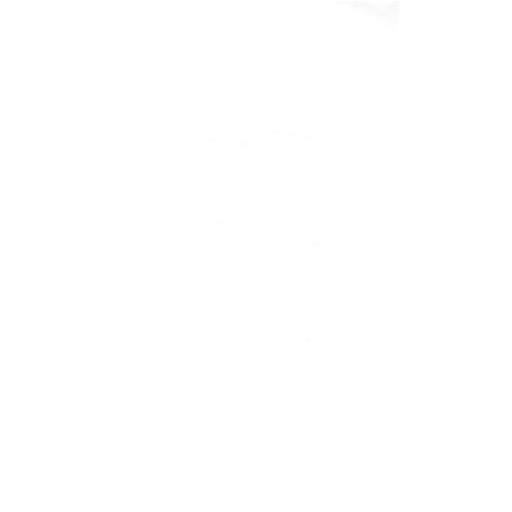

[Series 608: pet sagittal · 2 of 185 slices shown]
[im 1/185]
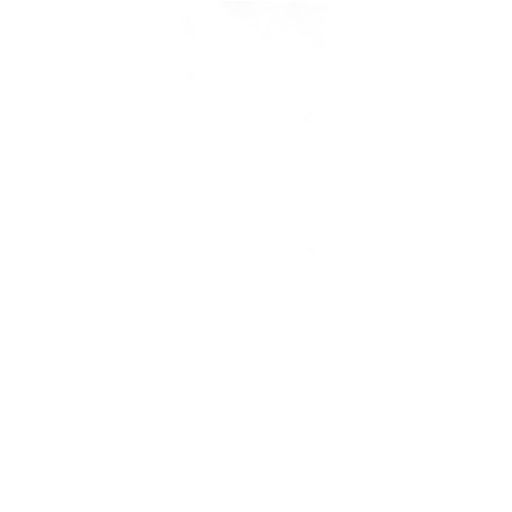
[im 185/185]
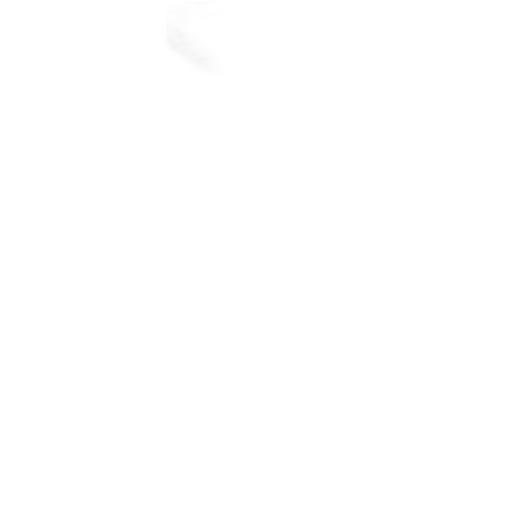

[Series 1038: results mm oncology reading · 5.0mm · 0.95mm/px · 1 of 4 slices shown]
[im 1/4]
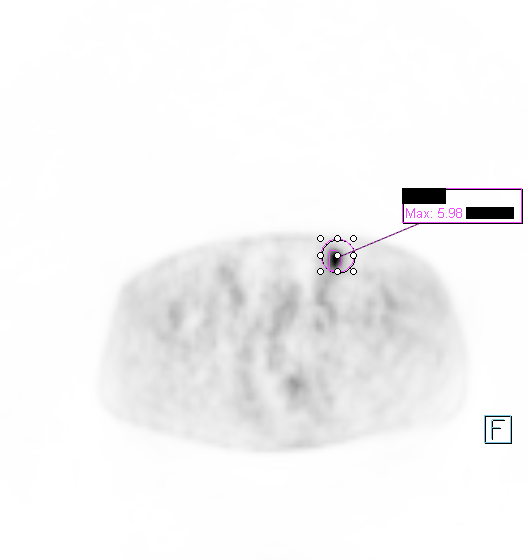

[25 of 25 positions shown; findings below may reference images not displayed]

FINDINGS: NECK are with sat

No evidence for hypermetabolic lymphadenopathy.

CHEST

No hypermetabolic mediastinal or hilar nodes. No suspicious
pulmonary nodules on the CT scan.

ABDOMEN/PELVIS

No hypermetabolic lesions in the liver or spleen although the
patient does have a 4.7 cm hypo attenuating lesion in the anterior
right liver in a 3.2 cm hypo attenuating lesion in the dome of the
liver.

No evidence for hypermetabolic lymphadenopathy in the abdomen. 8 mm
short axis left para-aortic lymph node on image 161 series 3 shows
borderline FDG uptake with SUV max = 2.3 .

1.7 x 2.5 cm left groin lymph node is hypermetabolic with SUV max =
7.1. Other small lymph nodes in the left groin region show
hypermetabolism. 13 mm short axis left common femoral lymph node
seen on image 2 ordered 18 series 3 demonstrates SUV max =

There is abdominal aortic atherosclerosis without aneurysm.

SKELETON

No focal hypermetabolic activity to suggest skeletal metastasis.
IMPRESSION: 1. Hypermetabolic lymphadenopathy in the common femoral region and
left groin area. Upper normal left para-aortic lymph node in the
abdomen shows borderline FDG uptake.

## 2016-09-17 DIAGNOSIS — J019 Acute sinusitis, unspecified: Secondary | ICD-10-CM | POA: Diagnosis not present

## 2016-09-17 DIAGNOSIS — J302 Other seasonal allergic rhinitis: Secondary | ICD-10-CM | POA: Diagnosis not present

## 2016-09-17 DIAGNOSIS — Z1389 Encounter for screening for other disorder: Secondary | ICD-10-CM | POA: Diagnosis not present

## 2017-02-25 DIAGNOSIS — Z23 Encounter for immunization: Secondary | ICD-10-CM | POA: Diagnosis not present

## 2017-11-03 DIAGNOSIS — M542 Cervicalgia: Secondary | ICD-10-CM | POA: Diagnosis not present

## 2018-01-05 DIAGNOSIS — E663 Overweight: Secondary | ICD-10-CM | POA: Diagnosis not present

## 2018-01-05 DIAGNOSIS — Z6826 Body mass index (BMI) 26.0-26.9, adult: Secondary | ICD-10-CM | POA: Diagnosis not present

## 2018-01-05 DIAGNOSIS — Z0000 Encounter for general adult medical examination without abnormal findings: Secondary | ICD-10-CM | POA: Diagnosis not present

## 2018-01-06 DIAGNOSIS — Z1389 Encounter for screening for other disorder: Secondary | ICD-10-CM | POA: Diagnosis not present

## 2018-01-06 DIAGNOSIS — Z0000 Encounter for general adult medical examination without abnormal findings: Secondary | ICD-10-CM | POA: Diagnosis not present

## 2018-03-31 DIAGNOSIS — Z23 Encounter for immunization: Secondary | ICD-10-CM | POA: Diagnosis not present

## 2018-11-03 DIAGNOSIS — Z1389 Encounter for screening for other disorder: Secondary | ICD-10-CM | POA: Diagnosis not present

## 2018-11-03 DIAGNOSIS — I10 Essential (primary) hypertension: Secondary | ICD-10-CM | POA: Diagnosis not present

## 2018-11-03 DIAGNOSIS — E663 Overweight: Secondary | ICD-10-CM | POA: Diagnosis not present

## 2018-11-03 DIAGNOSIS — Z6826 Body mass index (BMI) 26.0-26.9, adult: Secondary | ICD-10-CM | POA: Diagnosis not present

## 2019-06-02 DIAGNOSIS — Z23 Encounter for immunization: Secondary | ICD-10-CM | POA: Diagnosis not present

## 2019-08-27 ENCOUNTER — Ambulatory Visit: Payer: Self-pay | Attending: Internal Medicine

## 2019-08-27 DIAGNOSIS — Z23 Encounter for immunization: Secondary | ICD-10-CM

## 2019-08-27 NOTE — Progress Notes (Signed)
   Covid-19 Vaccination Clinic  Name:  Spencer Carter    MRN: [id number] DOB: 1956-05-24  08/27/2019  Spencer Carter was observed post Covid-19 immunization for 15 minutes without incident. He was provided with Vaccine Information Sheet and instruction to access the V-Safe system.   Spencer Carter was instructed to call 911 with any severe reactions post vaccine: Marland Kitchen Difficulty breathing  . Swelling of face and throat  . A fast heartbeat  . A bad rash all over body  . Dizziness and weakness   Immunizations Administered    Name Date Dose VIS Date Route   Moderna COVID-19 Vaccine 08/27/2019  9:34 AM 0.5 mL 05/17/2019 Intramuscular   Manufacturer: Moderna   Lot: [id number]   [id number]

## 2019-09-12 DIAGNOSIS — E663 Overweight: Secondary | ICD-10-CM | POA: Diagnosis not present

## 2019-09-12 DIAGNOSIS — Z6827 Body mass index (BMI) 27.0-27.9, adult: Secondary | ICD-10-CM | POA: Diagnosis not present

## 2019-09-12 DIAGNOSIS — I10 Essential (primary) hypertension: Secondary | ICD-10-CM | POA: Diagnosis not present

## 2019-09-12 DIAGNOSIS — E7849 Other hyperlipidemia: Secondary | ICD-10-CM | POA: Diagnosis not present

## 2019-09-14 ENCOUNTER — Encounter: Payer: Self-pay | Admitting: *Deleted

## 2019-09-18 ENCOUNTER — Ambulatory Visit: Payer: Self-pay

## 2019-09-28 ENCOUNTER — Ambulatory Visit: Payer: Medicare HMO | Attending: Internal Medicine

## 2019-09-28 DIAGNOSIS — Z23 Encounter for immunization: Secondary | ICD-10-CM

## 2019-09-28 NOTE — Progress Notes (Signed)
   Covid-19 Vaccination Clinic  Name:  Spencer Carter    MRN: [id number] DOB: 11-08-1955  09/28/2019  Spencer Carter was observed post Covid-19 immunization for 15 minutes without incident. He was provided with Vaccine Information Sheet and instruction to access the V-Safe system.   Spencer Carter was instructed to call 911 with any severe reactions post vaccine: Marland Kitchen Difficulty breathing  . Swelling of face and throat  . A fast heartbeat  . A bad rash all over body  . Dizziness and weakness   Immunizations Administered    Name Date Dose VIS Date Route   Moderna COVID-19 Vaccine 09/28/2019  8:56 AM 0.5 mL 05/17/2019 Intramuscular   Manufacturer: Moderna   Lot: [id number]   [id number]

## 2019-10-24 ENCOUNTER — Ambulatory Visit: Payer: Medicare HMO

## 2020-01-17 ENCOUNTER — Ambulatory Visit: Payer: Self-pay

## 2020-01-17 ENCOUNTER — Other Ambulatory Visit: Payer: Self-pay

## 2020-01-17 ENCOUNTER — Encounter: Payer: Self-pay | Admitting: Internal Medicine

## 2020-01-19 ENCOUNTER — Telehealth: Payer: Self-pay

## 2020-01-19 NOTE — Telephone Encounter (Signed)
Pt received a letter for a missed apt. Pt apologized for not getting the message about his apt. Please call pt to arrange his nurse visit.

## 2020-01-19 NOTE — Telephone Encounter (Signed)
Called pt back and scheduled nurse visit for 02/29/2020 at 4:00.

## 2020-02-29 ENCOUNTER — Ambulatory Visit (INDEPENDENT_AMBULATORY_CARE_PROVIDER_SITE_OTHER): Payer: Self-pay | Admitting: *Deleted

## 2020-02-29 ENCOUNTER — Other Ambulatory Visit: Payer: Self-pay

## 2020-02-29 VITALS — Ht 67.5 in | Wt 177.0 lb

## 2020-02-29 DIAGNOSIS — Z1211 Encounter for screening for malignant neoplasm of colon: Secondary | ICD-10-CM

## 2020-02-29 NOTE — Patient Instructions (Addendum)
[place]   Please notify us immediately if you are diabetic, take iron supplements, or if you are on coumadin or any blood thinners.   Patient Name: Fuad Forget Date of procedure: 04/02/2020 Time to register at [place] Stay: 7:30 am Provider: Dr. Abbey Chatters   Purchase: MIRALAX 238 gram bottle, 1 FLEET ENEMA, 1 box of DULCOLAX (All over the counter medications)    03/31/2020- 2 Days prior to procedure: START CLEAR LIQUID DIET AFTER YOUR LUNCH MEAL--NO SOLID FOODS!   04/01/2020- 1 Day prior to procedure:   CLEAR LIQUIDS ALL DAY--NO SOLID FOODS!   Diabetic medication adjustments for today:    At 10:00 AM, take 2 DULCOLAX 31m tablets   At 12:00 PM, Mix 5 teaspoons of Miralax in any 4-6 ounces of CLEAR LIQUIDS (Gatorade) every hour for 5 hours until passing clear, watery stools. Be sure to drink 4 ounces of clear liquid 30 minutes after each dose of Miralax.   At 3:00 PM, take 2 Dulcolax 553mtablets   If stools are not clear & watery by 6:00 PM, take 5 teaspoons of Miralax every 30 minutes until stools are clear (no color)   You must have a complete prep to ensure the most effective cleaning.   CONTINUE CLEAR LIQUIDS ONLY UNTIL MIDNIGHT. Make a conscious effort to drink as much as you can before, during & after the preparation.    NOTHING TO EAT OR DRINK AFTER MIDNIGHT except for your heart, blood pressure & breathing medications. You may take them with a sip of clear liquids.    04/02/2020- Day of Procedure  Give yourself one Fleet enema about 1 hour prior to leaving for the hospital.   Diabetic medication adjustments for today:   You may take TYLENOL products. Please continue your regular medications unless we have instructed you otherwise.    Please note, on the day of your procedure you MUST be accompanied by an adult who is willing to assume responsibility for you at time of discharge. If you do not have such person with you, your  procedure will have to be rescheduled.                                                             Please leave ALL jewelry at home prior to coming to the hospital for your procedure.   *It is your responsibility to check with your insurance company for the benefits of coverage you have for this procedure. Unfortunately, not all insurance companies have benefits to cover all or part of these types of procedures. It is your responsibility to check your benefits, however we will be glad to assist you with any codes your insurance company may need.   Please note that most insurance companies will not cover a screening colonoscopy for people under the age of 5071For example, with some insurance companies you may have benefits for a screening colonoscopy, but if polyps are found the diagnosis will change and then you may have a deductible that will need to be met. Please make sure you check your benefits for screening colonoscopy as well as a diagnostic colonoscopy.    CLEAR LIQUIDS: (NO RED)  Jello Apple Juice White Grape Juice Water  Banana popsicles Kool-Aid Coffee(No cream or milk)  Tea (  No cream or milk) Soft drinks Broth (fat free beef/chicken/vegetable)   Clear liquids allow you to see your fingers on the other side of the glass. Be sure they are NOT RED in color, cloudy, but CLEAR.   Do Not Eat:  Dairy products of any kind Cranberry juice  Tomato or V8 Juice Orange Juice  Grapefruit Juice Red Grape Juice  Solid foods like cereal, oatmeal, yogurt, fruits, vegetables, creamed soups, eggs, bread, etc   HELPFUL HINTS TO MAKE DRINKING EASIER:  -Make sure prep is extremely COLD. Refrigerate the night before. You may also put in freezer.  -You may try mixing Crystal Light or Country Time Lemonade if you prefer. MIx in small amounts. Add more if necessary.  -Trying drinking through a straw.  -Rinse mouth with water or mouthwash  between glasses to remove aftertaste.  -Try sipping on a cold beverage/ice popsicles between glasses of prep.  -Place a piece of sugar-free hard candy in mouth between glasses.  -If you become nauseated, try consuming smaller amounts or stretch out the time between glasses. Stop for 30 minutes to an hour & slowly start back drinking.   Call our office with any questions or concerns at [phone].   Thank You,   Christ Kick, [place]

## 2020-02-29 NOTE — Progress Notes (Signed)
Gastroenterology Pre-Procedure Review  Request Date: 02/29/2020 Requesting Physician: [place], Last TCS 03/30/2006 done by Dr. Oneida Alar, hyperplastic polyps  PATIENT REVIEW QUESTIONS: The patient responded to the following health history questions as indicated:    1. Diabetes Melitis: no 2. Joint replacements in the past 12 months: no 3. Major health problems in the past 3 months: no 4. Has an artificial valve or MVP: no 5. Has a defibrillator: no 6. Has been advised in past to take antibiotics in advance of a procedure like teeth cleaning: no 7. Family history of colon cancer: no  8. Alcohol Use: no 9. Illicit drug Use: no 10. History of sleep apnea: no  11. History of coronary artery or other vascular stents placed within the last 12 months: no 12. History of any prior anesthesia complications: no 13. Body mass index is 27.31 kg/m.    MEDICATIONS & ALLERGIES:    Patient reports the following regarding taking any blood thinners:   Plavix? no Aspirin? no Coumadin? no Brilinta? no Xarelto? no Eliquis? no Pradaxa? no Savaysa? no Effient? no  Patient confirms/reports the following medications:  Current Outpatient Medications  Medication Sig Dispense Refill  . fluticasone (FLONASE) 50 MCG/ACT nasal spray Place 1 spray into both nostrils as needed.  1  . losartan (COZAAR) 100 MG tablet Take 100 mg by mouth daily.  0  . rosuvastatin (CRESTOR) 10 MG tablet Take 10 mg by mouth daily.     No current facility-administered medications for this visit.    Patient confirms/reports the following allergies:  No Known Allergies  No orders of the defined types were placed in this encounter.   AUTHORIZATION INFORMATION Primary Insurance: [employer] Medicare,  ID #:[id number] ,  Group #: [id number] Pre-Cert / Josem Kaufmann required: No, not required  SCHEDULE INFORMATION: Procedure has been scheduled as follows:  Date: 04/02/2020, Time: 9:00 Location: [facility] with Dr. Abbey Chatters  This  Gastroenterology Pre-Precedure Review Form is being routed to the following provider(s): Walden Field, NP

## 2020-03-16 NOTE — Progress Notes (Signed)
Ok to schedule.

## 2020-03-27 ENCOUNTER — Other Ambulatory Visit: Payer: Self-pay | Admitting: *Deleted

## 2020-03-27 NOTE — Progress Notes (Signed)
Appears ASA I/II

## 2020-03-29 ENCOUNTER — Encounter (HOSPITAL_COMMUNITY): Payer: Self-pay | Admitting: *Deleted

## 2020-03-30 ENCOUNTER — Other Ambulatory Visit (HOSPITAL_COMMUNITY)
Admission: RE | Admit: 2020-03-30 | Discharge: 2020-03-30 | Disposition: A | Discharge status: Home / Self Care | Payer: Medicare HMO | Source: Ambulatory Visit | Attending: Internal Medicine | Admitting: Internal Medicine

## 2020-03-30 ENCOUNTER — Other Ambulatory Visit: Payer: Self-pay

## 2020-03-30 DIAGNOSIS — Z20822 Contact with and (suspected) exposure to covid-19: Secondary | ICD-10-CM | POA: Insufficient documentation

## 2020-03-30 DIAGNOSIS — Z01812 Encounter for preprocedural laboratory examination: Secondary | ICD-10-CM | POA: Diagnosis present

## 2020-03-31 LAB — SARS CORONAVIRUS 2 (TAT 6-24 HRS): SARS Coronavirus 2: NEGATIVE

## 2020-04-02 ENCOUNTER — Encounter (HOSPITAL_COMMUNITY): Payer: Self-pay

## 2020-04-02 ENCOUNTER — Ambulatory Visit (HOSPITAL_COMMUNITY): Payer: Medicare HMO | Admitting: Anesthesiology

## 2020-04-02 ENCOUNTER — Ambulatory Visit (HOSPITAL_COMMUNITY)
Admission: RE | Admit: 2020-04-02 | Discharge: 2020-04-02 | Disposition: A | Discharge status: Home / Self Care | Payer: Medicare HMO | Attending: Internal Medicine | Admitting: Internal Medicine

## 2020-04-02 ENCOUNTER — Encounter (HOSPITAL_COMMUNITY)
Admission: RE | Disposition: A | Discharge status: Home / Self Care | Payer: Self-pay | Source: Home / Self Care | Attending: Internal Medicine

## 2020-04-02 DIAGNOSIS — K648 Other hemorrhoids: Secondary | ICD-10-CM | POA: Diagnosis not present

## 2020-04-02 DIAGNOSIS — Z8249 Family history of ischemic heart disease and other diseases of the circulatory system: Secondary | ICD-10-CM | POA: Diagnosis not present

## 2020-04-02 DIAGNOSIS — Z1211 Encounter for screening for malignant neoplasm of colon: Secondary | ICD-10-CM

## 2020-04-02 DIAGNOSIS — K5730 Diverticulosis of large intestine without perforation or abscess without bleeding: Secondary | ICD-10-CM | POA: Insufficient documentation

## 2020-04-02 DIAGNOSIS — K635 Polyp of colon: Secondary | ICD-10-CM

## 2020-04-02 DIAGNOSIS — I10 Essential (primary) hypertension: Secondary | ICD-10-CM | POA: Diagnosis not present

## 2020-04-02 DIAGNOSIS — E7800 Pure hypercholesterolemia, unspecified: Secondary | ICD-10-CM | POA: Insufficient documentation

## 2020-04-02 DIAGNOSIS — Z79899 Other long term (current) drug therapy: Secondary | ICD-10-CM | POA: Diagnosis not present

## 2020-04-02 DIAGNOSIS — D123 Benign neoplasm of transverse colon: Secondary | ICD-10-CM | POA: Insufficient documentation

## 2020-04-02 DIAGNOSIS — D122 Benign neoplasm of ascending colon: Secondary | ICD-10-CM | POA: Insufficient documentation

## 2020-04-02 HISTORY — PX: POLYPECTOMY: SHX5525

## 2020-04-02 HISTORY — DX: Essential (primary) hypertension: I10

## 2020-04-02 HISTORY — PX: COLONOSCOPY WITH PROPOFOL: SHX5780

## 2020-04-02 HISTORY — DX: Pure hypercholesterolemia, unspecified: E78.00

## 2020-04-02 SURGERY — COLONOSCOPY WITH PROPOFOL
Anesthesia: General

## 2020-04-02 MED ORDER — PROPOFOL 10 MG/ML IV BOLUS
INTRAVENOUS | Status: DC | PRN
  Administered 2020-04-02: 20 mg via INTRAVENOUS
  Administered 2020-04-02: 40 mg via INTRAVENOUS
  Administered 2020-04-02: 50 mg via INTRAVENOUS

## 2020-04-02 MED ORDER — STERILE WATER FOR IRRIGATION IR SOLN
Status: DC | PRN
  Administered 2020-04-02: 100 mL

## 2020-04-02 MED ORDER — LACTATED RINGERS IV SOLN: INTRAVENOUS | Status: DC | PRN

## 2020-04-02 MED ORDER — PROPOFOL 500 MG/50ML IV EMUL
INTRAVENOUS | Status: DC | PRN
  Administered 2020-04-02: 150 ug/kg/min via INTRAVENOUS

## 2020-04-02 NOTE — H&P (Signed)
Primary Care Physician:  Redmond School, MD Primary Gastroenterologist:  Dr. Abbey Chatters  Pre-Procedure History & Physical: HPI:  Spencer Carter is a 64 y.o. male is here for a screening colonoscopy.   Past Medical History:  Diagnosis Date  . Hypercholesterolemia   . Hypertension   . Prosthetic eye globe    right eye    Past Surgical History:  Procedure Laterality Date  . EYE SURGERY Right 1974    Prior to Admission medications   Medication Sig Start Date End Date Taking? Authorizing Provider  losartan (COZAAR) 100 MG tablet Take 100 mg by mouth daily. 04/10/15  Yes [provider]  rosuvastatin (CRESTOR) 10 MG tablet Take 10 mg by mouth daily.   Yes [provider]  fluticasone (FLONASE) 50 MCG/ACT nasal spray Place 1-2 sprays into both nostrils daily as needed for allergies.  04/12/15   [provider]    Allergies as of 03/27/2020  . (No Known Allergies)    Family History  Problem Relation Age of Onset  . Heart disease Mother   . Cancer Sister     Social History   Socioeconomic History  . Marital status: Married    Spouse name: Not on file  . Number of children: Not on file  . Years of education: Not on file  . Highest education level: Not on file  Occupational History  . Not on file  Tobacco Use  . Smoking status: Never Smoker  Vaping Use  . Vaping Use: Never used  Substance and Sexual Activity  . Alcohol use: No  . Drug use: Never  . Sexual activity: Not on file  Other Topics Concern  . Not on file  Social History Narrative  . Not on file   Social Determinants of Health   Financial Resource Strain:   . Difficulty of Paying Living Expenses: Not on file  Food Insecurity:   . Worried About Charity fundraiser in the Last Year: Not on file  . Ran Out of Food in the Last Year: Not on file  Transportation Needs:   . Lack of Transportation (Medical): Not on file  . Lack of Transportation (Non-Medical): Not on file  Physical  Activity:   . Days of Exercise per Week: Not on file  . Minutes of Exercise per Session: Not on file  Stress:   . Feeling of Stress : Not on file  Social Connections:   . Frequency of Communication with Friends and Family: Not on file  . Frequency of Social Gatherings with Friends and Family: Not on file  . Attends Religious Services: Not on file  . Active Member of Clubs or Organizations: Not on file  . Attends Archivist Meetings: Not on file  . Marital Status: Not on file  Intimate Partner Violence:   . Fear of Current or Ex-Partner: Not on file  . Emotionally Abused: Not on file  . Physically Abused: Not on file  . Sexually Abused: Not on file    Review of Systems: See HPI, otherwise negative ROS  Impression/Plan: Spencer Carter is here for a colonoscopy to be performed for screening  Risks, benefits, limitations, imponderables and alternatives regarding colonoscopy have been reviewed with the patient. Questions have been answered. All parties agreeable.

## 2020-04-02 NOTE — Anesthesia Postprocedure Evaluation (Signed)
Anesthesia Post Note  Patient: Spencer Carter  Procedure(s) Performed: COLONOSCOPY WITH PROPOFOL (N/A ) POLYPECTOMY  Patient location during evaluation: Endoscopy Anesthesia Type: General Level of consciousness: awake and alert Pain management: pain level controlled Vital Signs Assessment: post-procedure vital signs reviewed and stable Respiratory status: spontaneous breathing Cardiovascular status: blood pressure returned to baseline and stable Postop Assessment: no apparent nausea or vomiting Anesthetic complications: no   No complications documented.   Last Vitals:  Vitals:   04/02/20 0715  BP: (!) 162/96  Pulse: 74  Resp: 15  Temp: 36.7 C  SpO2: 100%    Last Pain:  Vitals:   04/02/20 0822  TempSrc:   PainSc: 0-No pain                 Jordany Russett

## 2020-04-02 NOTE — Discharge Instructions (Addendum)
Colonoscopy Discharge Instructions  Read the instructions outlined below and refer to this sheet in the next few weeks. These discharge instructions provide you with general information on caring for yourself after you leave the hospital. Your doctor may also give you specific instructions. While your treatment has been planned according to the most current medical practices available, unavoidable complications occasionally occur.   ACTIVITY  You may resume your regular activity, but move at a slower pace for the next 24 hours.   Take frequent rest periods for the next 24 hours.   Walking will help get rid of the air and reduce the bloated feeling in your belly (abdomen).   No driving for 24 hours (because of the medicine (anesthesia) used during the test).    Do not sign any important legal documents or operate any machinery for 24 hours (because of the anesthesia used during the test).  NUTRITION  Drink plenty of fluids.   You may resume your normal diet as instructed by your doctor.   Begin with a light meal and progress to your normal diet. Heavy or fried foods are harder to digest and may make you feel sick to your stomach (nauseated).   Avoid alcoholic beverages for 24 hours or as instructed.  MEDICATIONS  You may resume your normal medications unless your doctor tells you otherwise.  WHAT YOU CAN EXPECT TODAY  Some feelings of bloating in the abdomen.   Passage of more gas than usual.   Spotting of blood in your stool or on the toilet paper.  IF YOU HAD POLYPS REMOVED DURING THE COLONOSCOPY:  No aspirin products for 7 days or as instructed.   No alcohol for 7 days or as instructed.   Eat a soft diet for the next 24 hours.  FINDING OUT THE RESULTS OF YOUR TEST Not all test results are available during your visit. If your test results are not back during the visit, make an appointment with your caregiver to find out the results. Do not assume everything is normal if  you have not heard from your caregiver or the medical facility. It is important for you to follow up on all of your test results.  SEEK IMMEDIATE MEDICAL ATTENTION IF:  You have more than a spotting of blood in your stool.   Your belly is swollen (abdominal distention).   You are nauseated or vomiting.   You have a temperature over 101.   You have abdominal pain or discomfort that is severe or gets worse throughout the day.   Your colonoscopy revealed 4 polyps which I removed successfully. Await pathology results, my office will contact you. I recommend repeating colonoscopy in 5 years for surveillance purposes.   You also have diverticulosis and internal hemorrhoids. I would recommend increasing fiber in your diet or adding OTC Benefiber/Metamucil. Be sure to drink at least 4 to 6 glasses of water daily. Follow-up with GI as needed.  I hope you have a great rest of your week!  Elon Alas. Abbey Chatters, D.O. Gastroenterology and Hepatology [facility]   Colon Polyps  Polyps are tissue growths inside the body. Polyps can grow in many places, including the large intestine (colon). A polyp may be a round bump or a mushroom-shaped growth. You could have one polyp or several. Most colon polyps are noncancerous (benign). However, some colon polyps can become cancerous over time. Finding and removing the polyps early can help prevent this. What are the causes? The exact cause of colon  polyps is not known. What increases the risk? You are more likely to develop this condition if you:  Have a family history of colon cancer or colon polyps.  Are older than 67 or older than 45 if you are African American.  Have inflammatory bowel disease, such as ulcerative colitis or Crohn's disease.  Have certain hereditary conditions, such as: ? Familial adenomatous polyposis. ? Lynch syndrome. ? Turcot syndrome. ? Peutz-Jeghers syndrome.  Are overweight.  Smoke  cigarettes.  Do not get enough exercise.  Drink too much alcohol.  Eat a diet that is high in fat and red meat and low in fiber.  Had childhood cancer that was treated with abdominal radiation. What are the signs or symptoms? Most polyps do not cause symptoms. If you have symptoms, they may include:  Blood coming from your rectum when having a bowel movement.  Blood in your stool. The stool may look dark red or black.  Abdominal pain.  A change in bowel habits, such as constipation or diarrhea. How is this diagnosed? This condition is diagnosed with a colonoscopy. This is a procedure in which a lighted, flexible scope is inserted into the anus and then passed into the colon to examine the area. Polyps are sometimes found when a colonoscopy is done as part of routine cancer screening tests. How is this treated? Treatment for this condition involves removing any polyps that are found. Most polyps can be removed during a colonoscopy. Those polyps will then be tested for cancer. Additional treatment may be needed depending on the results of testing. Follow these instructions at home: Lifestyle  Maintain a healthy weight, or lose weight if recommended by your health care provider.  Exercise every day or as told by your health care provider.  Do not use any products that contain nicotine or tobacco, such as cigarettes and e-cigarettes. If you need help quitting, ask your health care provider.  If you drink alcohol, limit how much you have: ? 0-1 drink a day for women. ? 0-2 drinks a day for men.  Be aware of how much alcohol is in your drink. In the [place] one drink equals one 12 oz bottle of beer (355 mL), one 5 oz glass of wine (148 mL), or one 1 oz shot of hard liquor (44 mL). Eating and drinking   Eat foods that are high in fiber, such as fruits, vegetables, and whole grains.  Eat foods that are high in calcium and vitamin D, such as milk, cheese, yogurt, eggs, liver, fish,  and broccoli.  Limit foods that are high in fat, such as fried foods and desserts.  Limit the amount of red meat and processed meat you eat, such as hot dogs, sausage, bacon, and lunch meats. General instructions  Keep all follow-up visits as told by your health care provider. This is important. ? This includes having regularly scheduled colonoscopies. ? Talk to your health care provider about when you need a colonoscopy. Contact a health care provider if:  You have new or worsening bleeding during a bowel movement.  You have new or increased blood in your stool.  You have a change in bowel habits.  You lose weight for no known reason. Summary  Polyps are tissue growths inside the body. Polyps can grow in many places, including the colon.  Most colon polyps are noncancerous (benign), but some can become cancerous over time.  This condition is diagnosed with a colonoscopy.  Treatment for this condition involves removing any  polyps that are found. Most polyps can be removed during a colonoscopy. This information is not intended to replace advice given to you by your health care provider. Make sure you discuss any questions you have with your health care provider. Document Revised: 09/17/2017 Document Reviewed: 09/17/2017 Elsevier Patient Education  [place].  Diverticulosis  Diverticulosis is a condition that develops when small pouches (diverticula) form in the wall of the large intestine (colon). The colon is where water is absorbed and stool (feces) is formed. The pouches form when the inside layer of the colon pushes through weak spots in the outer layers of the colon. You may have a few pouches or many of them. The pouches usually do not cause problems unless they become inflamed or infected. When this happens, the condition is called diverticulitis. What are the causes? The cause of this condition is not known. What increases the risk? The following factors may  make you more likely to develop this condition:  Being older than age 45. Your risk for this condition increases with age. Diverticulosis is rare among people younger than age 68. By age 20, many people have it.  Eating a low-fiber diet.  Having frequent constipation.  Being overweight.  Not getting enough exercise.  Smoking.  Taking over-the-counter pain medicines, like aspirin and ibuprofen.  Having a family history of diverticulosis. What are the signs or symptoms? In most people, there are no symptoms of this condition. If you do have symptoms, they may include:  Bloating.  Cramps in the abdomen.  Constipation or diarrhea.  Pain in the lower left side of the abdomen. How is this diagnosed? Because diverticulosis usually has no symptoms, it is most often diagnosed during an exam for other colon problems. The condition may be diagnosed by:  Using a flexible scope to examine the colon (colonoscopy).  Taking an X-ray of the colon after dye has been put into the colon (barium enema).  Having a CT scan. How is this treated? You may not need treatment for this condition. Your health care provider may recommend treatment to prevent problems. You may need treatment if you have symptoms or if you previously had diverticulitis. Treatment may include:  Eating a high-fiber diet.  Taking a fiber supplement.  Taking a live bacteria supplement (probiotic).  Taking medicine to relax your colon. Follow these instructions at home: Medicines  Take over-the-counter and prescription medicines only as told by your health care provider.  If told by your health care provider, take a fiber supplement or probiotic. Constipation prevention Your condition may cause constipation. To prevent or treat constipation, you may need to:  Drink enough fluid to keep your urine pale yellow.  Take over-the-counter or prescription medicines.  Eat foods that are high in fiber, such as beans, whole  grains, and fresh fruits and vegetables.  Limit foods that are high in fat and processed sugars, such as fried or sweet foods.  General instructions  Try not to strain when you have a bowel movement.  Keep all follow-up visits as told by your health care provider. This is important. Contact a health care provider if you:  Have pain in your abdomen.  Have bloating.  Have cramps.  Have not had a bowel movement in 3 days. Get help right away if:  Your pain gets worse.  Your bloating becomes very bad.  You have a fever or chills, and your symptoms suddenly get worse.  You vomit.  You have bowel movements that  are bloody or black.  You have bleeding from your rectum. Summary  Diverticulosis is a condition that develops when small pouches (diverticula) form in the wall of the large intestine (colon).  You may have a few pouches or many of them.  This condition is most often diagnosed during an exam for other colon problems.  Treatment may include increasing the fiber in your diet, taking supplements, or taking medicines. This information is not intended to replace advice given to you by your health care provider. Make sure you discuss any questions you have with your health care provider. Document Revised: 12/30/2018 Document Reviewed: 12/30/2018 Elsevier Patient Education  [place].  Hemorrhoids Hemorrhoids are swollen veins that may develop:  In the butt (rectum). These are called internal hemorrhoids.  Around the opening of the butt (anus). These are called external hemorrhoids. Hemorrhoids can cause pain, itching, or bleeding. Most of the time, they do not cause serious problems. They usually get better with diet changes, lifestyle changes, and other home treatments. What are the causes? This condition may be caused by:  Having trouble pooping (constipation).  Pushing hard (straining) to poop.  Watery poop (diarrhea).  Pregnancy.  Being very  overweight (obese).  Sitting for long periods of time.  Heavy lifting or other activity that causes you to strain.  Anal sex.  Riding a bike for a long period of time. What are the signs or symptoms? Symptoms of this condition include:  Pain.  Itching or soreness in the butt.  Bleeding from the butt.  Leaking poop.  Swelling in the area.  One or more lumps around the opening of your butt. How is this diagnosed? A doctor can often diagnose this condition by looking at the affected area. The doctor may also:  Do an exam that involves feeling the area with a gloved hand (digital rectal exam).  Examine the area inside your butt using a small tube (anoscope).  Order blood tests. This may be done if you have lost a lot of blood.  Have you get a test that involves looking inside the colon using a flexible tube with a camera on the end (sigmoidoscopy or colonoscopy). How is this treated? This condition can usually be treated at home. Your doctor may tell you to change what you eat, make lifestyle changes, or try home treatments. If these do not help, procedures can be done to remove the hemorrhoids or make them smaller. These may involve:  Placing rubber bands at the base of the hemorrhoids to cut off their blood supply.  Injecting medicine into the hemorrhoids to shrink them.  Shining a type of light energy onto the hemorrhoids to cause them to fall off.  Doing surgery to remove the hemorrhoids or cut off their blood supply. Follow these instructions at home: Eating and drinking   Eat foods that have a lot of fiber in them. These include whole grains, beans, nuts, fruits, and vegetables.  Ask your doctor about taking products that have added fiber (fibersupplements).  Reduce the amount of fat in your diet. You can do this by: ? Eating low-fat dairy products. ? Eating less red meat. ? Avoiding processed foods.  Drink enough fluid to keep your pee (urine) pale  yellow. Managing pain and swelling   Take a warm-water bath (sitz bath) for 20 minutes to ease pain. Do this 3-4 times a day. You may do this in a bathtub or using a portable sitz bath that fits over the toilet.  If told, put ice on the painful area. It may be helpful to use ice between your warm baths. ? Put ice in a plastic bag. ? Place a towel between your skin and the bag. ? Leave the ice on for 20 minutes, 2-3 times a day. General instructions  Take over-the-counter and prescription medicines only as told by your doctor. ? Medicated creams and medicines may be used as told.  Exercise often. Ask your doctor how much and what kind of exercise is best for you.  Go to the bathroom when you have the urge to poop. Do not wait.  Avoid pushing too hard when you poop.  Keep your butt dry and clean. Use wet toilet paper or moist towelettes after pooping.  Do not sit on the toilet for a long time.  Keep all follow-up visits as told by your doctor. This is important. Contact a doctor if you:  Have pain and swelling that do not get better with treatment or medicine.  Have trouble pooping.  Cannot poop.  Have pain or swelling outside the area of the hemorrhoids. Get help right away if you have:  Bleeding that will not stop. Summary  Hemorrhoids are swollen veins in the butt or around the opening of the butt.  They can cause pain, itching, or bleeding.  Eat foods that have a lot of fiber in them. These include whole grains, beans, nuts, fruits, and vegetables.  Take a warm-water bath (sitz bath) for 20 minutes to ease pain. Do this 3-4 times a day. This information is not intended to replace advice given to you by your health care provider. Make sure you discuss any questions you have with your health care provider. Document Revised: 06/10/2018 Document Reviewed: 10/22/2017 Elsevier Patient Education  [place].

## 2020-04-02 NOTE — Transfer of Care (Signed)
Immediate Anesthesia Transfer of Care Note  Patient: Spencer Carter  Procedure(s) Performed: COLONOSCOPY WITH PROPOFOL (N/A ) POLYPECTOMY  Patient Location: PACU  Anesthesia Type:General  Level of Consciousness: awake  Airway & Oxygen Therapy: Patient Spontanous Breathing  Post-op Assessment: Report given to RN  Post vital signs: Reviewed  Last Vitals:  Vitals Value Taken Time  BP    Temp    Pulse    Resp    SpO2      Last Pain:  Vitals:   04/02/20 0822  TempSrc:   PainSc: 0-No pain      Patients Stated Pain Goal: 8 (46/21/94 7125)  Complications: No complications documented.

## 2020-04-02 NOTE — Anesthesia Preprocedure Evaluation (Signed)
Anesthesia Evaluation  Patient identified by MRN, date of birth, ID band Patient awake    Reviewed: Allergy & Precautions, H&P , NPO status , Patient's Chart, lab work & pertinent test results, reviewed documented beta blocker date and time   Airway Mallampati: II  TM Distance: >3 FB Neck ROM: full    Dental no notable dental hx. (+) Teeth Intact   Pulmonary neg pulmonary ROS,    Pulmonary exam normal breath sounds clear to auscultation       Cardiovascular Exercise Tolerance: Good hypertension, negative cardio ROS   Rhythm:regular Rate:Normal     Neuro/Psych negative neurological ROS  negative psych ROS   GI/Hepatic negative GI ROS, Neg liver ROS,   Endo/Other  negative endocrine ROS  Renal/GU negative Renal ROS  negative genitourinary   Musculoskeletal   Abdominal   Peds  Hematology negative hematology ROS (+)   Anesthesia Other Findings   Reproductive/Obstetrics negative OB ROS                             Anesthesia Physical Anesthesia Plan  ASA: II  Anesthesia Plan: General   Post-op Pain Management:    Induction:   PONV Risk Score and Plan: Propofol infusion  Airway Management Planned:   Additional Equipment:   Intra-op Plan:   Post-operative Plan:   Informed Consent: I have reviewed the patients History and Physical, chart, labs and discussed the procedure including the risks, benefits and alternatives for the proposed anesthesia with the patient or authorized representative who has indicated his/her understanding and acceptance.     Dental Advisory Given  Plan Discussed with: CRNA  Anesthesia Plan Comments:         Anesthesia Quick Evaluation

## 2020-04-02 NOTE — Op Note (Signed)
[facility] Patient Name: Spencer Carter Procedure Date: 04/02/2020 8:16 AM MRN: [id number] Date of Birth: January 11, 1956 Attending MD: Elon Alas. Abbey Chatters DO CSN: [id number] Age: 64 Admit Type: Outpatient Procedure:                Colonoscopy Indications:              Screening for colorectal malignant neoplasm Providers:                Elon Alas. Abbey Chatters, DO, Crystal Page, Wynonia Musty Tech, Technician Referring MD:              Medicines:                See the Anesthesia note for documentation of the                            administered medications Complications:            No immediate complications. Estimated Blood Loss:     Estimated blood loss was minimal. Procedure:                Pre-Anesthesia Assessment:                           - The anesthesia plan was to use monitored                            anesthesia care (MAC).                           After obtaining informed consent, the colonoscope                            was passed under direct vision. Throughout the                            procedure, the patient's blood pressure, pulse, and                            oxygen saturations were monitored continuously. The                            [id number]) was introduced through the anus                            and advanced to the the cecum, identified by                            appendiceal orifice and ileocecal valve. The                            colonoscopy was performed without difficulty. The                            patient tolerated the procedure well. The quality  of the bowel preparation was evaluated using the                            BBPS [facility] Bowel Preparation Scale) with scores                            of: Right Colon = 2 (minor amount of residual                            staining, small fragments of stool and/or opaque                            liquid, but mucosa seen  well), Transverse Colon = 2                            (minor amount of residual staining, small fragments                            of stool and/or opaque liquid, but mucosa seen                            well) and Left Colon = 2 (minor amount of residual                            staining, small fragments of stool and/or opaque                            liquid, but mucosa seen well). The total BBPS score                            equals 6. The quality of the bowel preparation was                            fair. Scope In: 8:36:11 AM Scope Out: 8:52:38 AM Scope Withdrawal Time: 0 hours 12 minutes 23 seconds  Total Procedure Duration: 0 hours 16 minutes 27 seconds  Findings:      The perianal and digital rectal examinations were normal.      Non-bleeding internal hemorrhoids were found during endoscopy.      Multiple small-mouthed diverticula were found in the sigmoid colon,       descending colon and transverse colon.      Three sessile polyps were found in the ascending colon. The polyps were       3 to 7 mm in size. These polyps were removed with a cold snare.       Resection and retrieval were complete.      A 2 mm polyp was found in the transverse colon. The polyp was sessile.       The polyp was removed with a cold biopsy forceps. Resection and       retrieval were complete. Impression:               - Preparation of the colon was fair.                           -  Non-bleeding internal hemorrhoids.                           - Diverticulosis in the sigmoid colon, in the                            descending colon and in the transverse colon.                           - Three 3 to 7 mm polyps in the ascending colon,                            removed with a cold snare. Resected and retrieved.                           - One 2 mm polyp in the transverse colon, removed                            with a cold biopsy forceps. Resected and retrieved. Moderate Sedation:      Per  Anesthesia Care Recommendation:           - Patient has a contact number available for                            emergencies. The signs and symptoms of potential                            delayed complications were discussed with the                            patient. Return to normal activities tomorrow.                            Written discharge instructions were provided to the                            patient.                           - Resume previous diet.                           - Continue present medications.                           - Await pathology results.                           - Repeat colonoscopy in 5 years for surveillance.                           - Return to GI clinic PRN. Procedure Code(s):        --- Professional ---  45385, Colonoscopy, flexible; with removal of                            tumor(s), polyp(s), or other lesion(s) by snare                            technique                           45380, 73, Colonoscopy, flexible; with biopsy,                            single or multiple Diagnosis Code(s):        --- Professional ---                           K63.5, Polyp of colon                           Z12.11, Encounter for screening for malignant                            neoplasm of colon                           K64.8, Other hemorrhoids                           K57.30, Diverticulosis of large intestine without                            perforation or abscess without bleeding CPT copyright 2019 American Medical Association. All rights reserved. The codes documented in this report are preliminary and upon coder review may  be revised to meet current compliance requirements. Elon Alas. Abbey Chatters, DO [place] Abbey Chatters, DO 04/02/2020 8:55:56 AM This report has been signed electronically. Number of Addenda: 0

## 2020-04-03 LAB — SURGICAL PATHOLOGY

## 2020-04-05 ENCOUNTER — Encounter (HOSPITAL_COMMUNITY): Payer: Self-pay | Admitting: Internal Medicine

## 2020-04-10 DIAGNOSIS — Z23 Encounter for immunization: Secondary | ICD-10-CM | POA: Diagnosis not present

## 2020-07-24 DIAGNOSIS — Z6827 Body mass index (BMI) 27.0-27.9, adult: Secondary | ICD-10-CM | POA: Diagnosis not present

## 2020-07-24 DIAGNOSIS — Z125 Encounter for screening for malignant neoplasm of prostate: Secondary | ICD-10-CM | POA: Diagnosis not present

## 2020-07-24 DIAGNOSIS — I10 Essential (primary) hypertension: Secondary | ICD-10-CM | POA: Diagnosis not present

## 2020-07-24 DIAGNOSIS — E782 Mixed hyperlipidemia: Secondary | ICD-10-CM | POA: Diagnosis not present

## 2020-07-24 DIAGNOSIS — Z1389 Encounter for screening for other disorder: Secondary | ICD-10-CM | POA: Diagnosis not present

## 2020-07-24 DIAGNOSIS — Z0000 Encounter for general adult medical examination without abnormal findings: Secondary | ICD-10-CM | POA: Diagnosis not present

## 2020-07-24 DIAGNOSIS — E7849 Other hyperlipidemia: Secondary | ICD-10-CM | POA: Diagnosis not present

## 2020-08-30 DIAGNOSIS — N400 Enlarged prostate without lower urinary tract symptoms: Secondary | ICD-10-CM | POA: Diagnosis not present

## 2020-08-30 DIAGNOSIS — I129 Hypertensive chronic kidney disease with stage 1 through stage 4 chronic kidney disease, or unspecified chronic kidney disease: Secondary | ICD-10-CM | POA: Diagnosis not present

## 2020-08-30 DIAGNOSIS — Z6828 Body mass index (BMI) 28.0-28.9, adult: Secondary | ICD-10-CM | POA: Diagnosis not present

## 2020-08-30 DIAGNOSIS — N1831 Chronic kidney disease, stage 3a: Secondary | ICD-10-CM | POA: Diagnosis not present

## 2020-08-30 DIAGNOSIS — R718 Other abnormality of red blood cells: Secondary | ICD-10-CM | POA: Diagnosis not present

## 2020-08-30 DIAGNOSIS — Z79899 Other long term (current) drug therapy: Secondary | ICD-10-CM | POA: Diagnosis not present

## 2020-09-03 DIAGNOSIS — I129 Hypertensive chronic kidney disease with stage 1 through stage 4 chronic kidney disease, or unspecified chronic kidney disease: Secondary | ICD-10-CM | POA: Diagnosis not present

## 2020-09-03 DIAGNOSIS — N1831 Chronic kidney disease, stage 3a: Secondary | ICD-10-CM | POA: Diagnosis not present

## 2020-09-03 DIAGNOSIS — Z79899 Other long term (current) drug therapy: Secondary | ICD-10-CM | POA: Diagnosis not present

## 2020-09-03 DIAGNOSIS — Z6828 Body mass index (BMI) 28.0-28.9, adult: Secondary | ICD-10-CM | POA: Diagnosis not present

## 2020-09-03 DIAGNOSIS — R718 Other abnormality of red blood cells: Secondary | ICD-10-CM | POA: Diagnosis not present

## 2020-09-28 ENCOUNTER — Other Ambulatory Visit: Payer: Self-pay | Admitting: Nephrology

## 2020-09-28 ENCOUNTER — Other Ambulatory Visit (HOSPITAL_COMMUNITY): Payer: Self-pay | Admitting: Nephrology

## 2020-09-28 DIAGNOSIS — I129 Hypertensive chronic kidney disease with stage 1 through stage 4 chronic kidney disease, or unspecified chronic kidney disease: Secondary | ICD-10-CM

## 2020-09-28 DIAGNOSIS — R7303 Prediabetes: Secondary | ICD-10-CM | POA: Diagnosis not present

## 2020-09-28 DIAGNOSIS — N1832 Chronic kidney disease, stage 3b: Secondary | ICD-10-CM | POA: Diagnosis not present

## 2020-09-28 DIAGNOSIS — D472 Monoclonal gammopathy: Secondary | ICD-10-CM | POA: Diagnosis not present

## 2020-09-28 DIAGNOSIS — E211 Secondary hyperparathyroidism, not elsewhere classified: Secondary | ICD-10-CM | POA: Diagnosis not present

## 2020-09-28 DIAGNOSIS — N1831 Chronic kidney disease, stage 3a: Secondary | ICD-10-CM

## 2020-09-28 DIAGNOSIS — E559 Vitamin D deficiency, unspecified: Secondary | ICD-10-CM | POA: Diagnosis not present

## 2020-09-28 DIAGNOSIS — D508 Other iron deficiency anemias: Secondary | ICD-10-CM | POA: Diagnosis not present

## 2020-09-28 DIAGNOSIS — R809 Proteinuria, unspecified: Secondary | ICD-10-CM | POA: Diagnosis not present

## 2020-10-08 ENCOUNTER — Ambulatory Visit (HOSPITAL_COMMUNITY)
Admission: RE | Admit: 2020-10-08 | Discharge: 2020-10-08 | Disposition: A | Discharge status: Home / Self Care | Payer: Medicare HMO | Source: Ambulatory Visit | Attending: Nephrology | Admitting: Nephrology

## 2020-10-08 DIAGNOSIS — N281 Cyst of kidney, acquired: Secondary | ICD-10-CM | POA: Diagnosis not present

## 2020-10-08 DIAGNOSIS — N1831 Chronic kidney disease, stage 3a: Secondary | ICD-10-CM | POA: Insufficient documentation

## 2020-10-08 DIAGNOSIS — I129 Hypertensive chronic kidney disease with stage 1 through stage 4 chronic kidney disease, or unspecified chronic kidney disease: Secondary | ICD-10-CM | POA: Insufficient documentation

## 2020-10-08 DIAGNOSIS — N183 Chronic kidney disease, stage 3 unspecified: Secondary | ICD-10-CM | POA: Diagnosis not present

## 2020-11-28 DIAGNOSIS — H5200 Hypermetropia, unspecified eye: Secondary | ICD-10-CM | POA: Diagnosis not present

## 2020-11-28 DIAGNOSIS — Z0100 Encounter for examination of eyes and vision without abnormal findings: Secondary | ICD-10-CM | POA: Diagnosis not present

## 2020-11-28 DIAGNOSIS — H40013 Open angle with borderline findings, low risk, bilateral: Secondary | ICD-10-CM | POA: Diagnosis not present

## 2021-01-28 DIAGNOSIS — Z8249 Family history of ischemic heart disease and other diseases of the circulatory system: Secondary | ICD-10-CM | POA: Diagnosis not present

## 2021-01-28 DIAGNOSIS — Z803 Family history of malignant neoplasm of breast: Secondary | ICD-10-CM | POA: Diagnosis not present

## 2021-01-28 DIAGNOSIS — I10 Essential (primary) hypertension: Secondary | ICD-10-CM | POA: Diagnosis not present

## 2021-01-28 DIAGNOSIS — Z008 Encounter for other general examination: Secondary | ICD-10-CM | POA: Diagnosis not present

## 2021-01-28 DIAGNOSIS — N529 Male erectile dysfunction, unspecified: Secondary | ICD-10-CM | POA: Diagnosis not present

## 2021-01-28 DIAGNOSIS — Z833 Family history of diabetes mellitus: Secondary | ICD-10-CM | POA: Diagnosis not present

## 2021-01-28 DIAGNOSIS — Z6827 Body mass index (BMI) 27.0-27.9, adult: Secondary | ICD-10-CM | POA: Diagnosis not present

## 2021-01-28 DIAGNOSIS — Z825 Family history of asthma and other chronic lower respiratory diseases: Secondary | ICD-10-CM | POA: Diagnosis not present

## 2021-01-28 DIAGNOSIS — E785 Hyperlipidemia, unspecified: Secondary | ICD-10-CM | POA: Diagnosis not present

## 2021-01-28 DIAGNOSIS — N400 Enlarged prostate without lower urinary tract symptoms: Secondary | ICD-10-CM | POA: Diagnosis not present

## 2021-01-28 DIAGNOSIS — E663 Overweight: Secondary | ICD-10-CM | POA: Diagnosis not present

## 2021-04-22 DIAGNOSIS — Z23 Encounter for immunization: Secondary | ICD-10-CM | POA: Diagnosis not present

## 2021-07-16 DIAGNOSIS — H1132 Conjunctival hemorrhage, left eye: Secondary | ICD-10-CM | POA: Diagnosis not present

## 2021-08-14 IMAGING — US US RENAL
1 series · 14 of 25 positions shown · non-contrast
Comparison: 05/02/2015 PET-CT.

CLINICAL DATA: Stage 3 chronic kidney disease.

EXAM:
RENAL / URINARY TRACT ULTRASOUND COMPLETE

[Series 1: us renal · 14 of 72 slices shown]
[im 1/72]
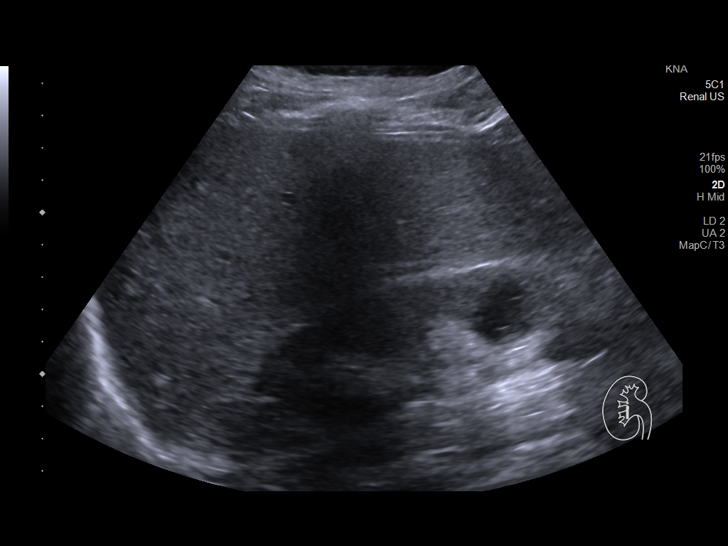
[im 6/72]
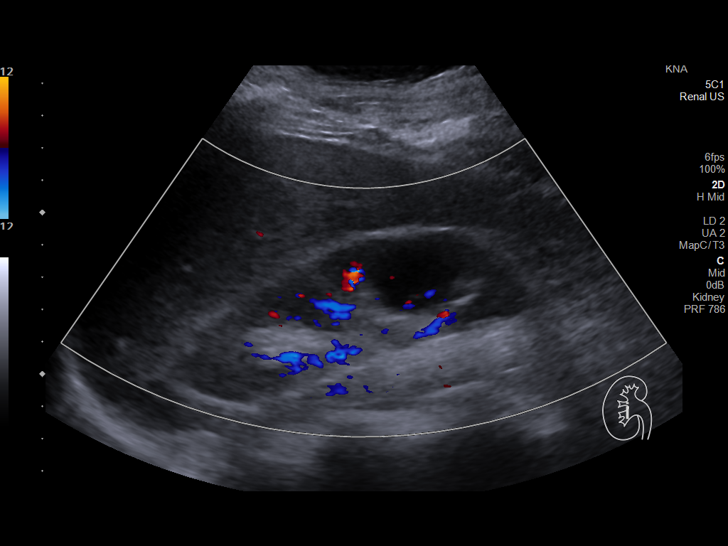
[im 12/72]
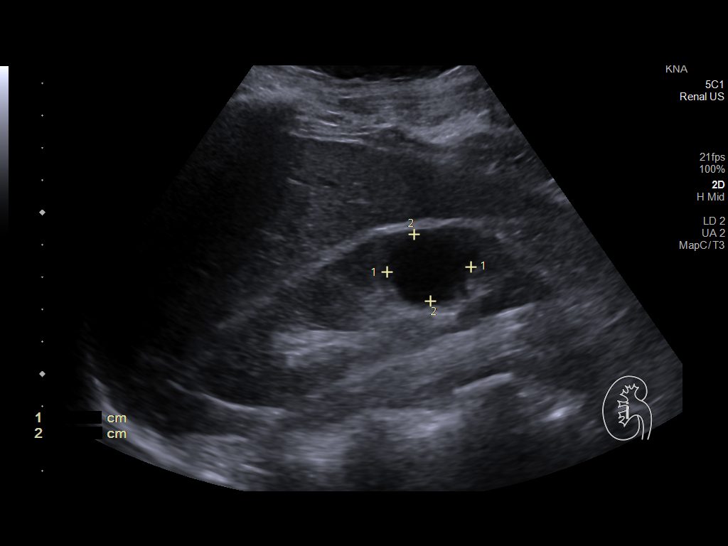
[im 18/72]
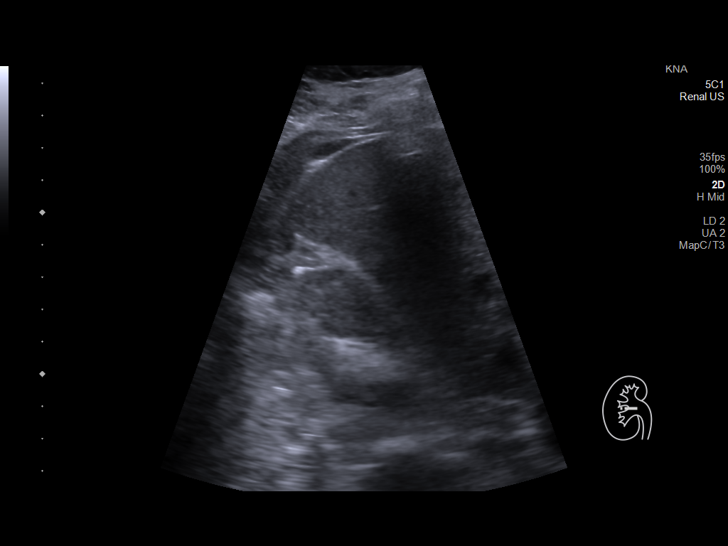
[im 24/72]
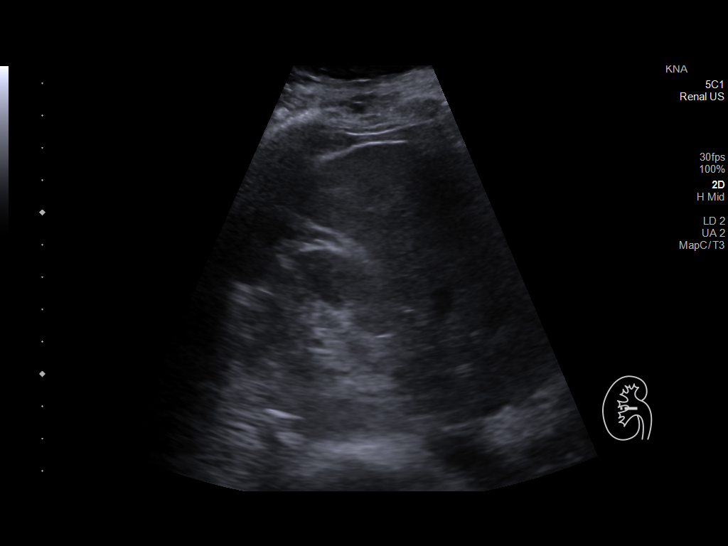
[im 27/72]
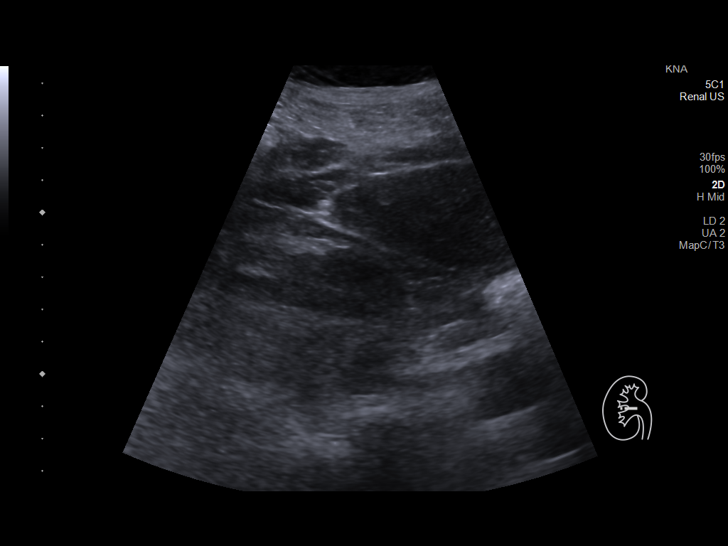
[im 33/72]
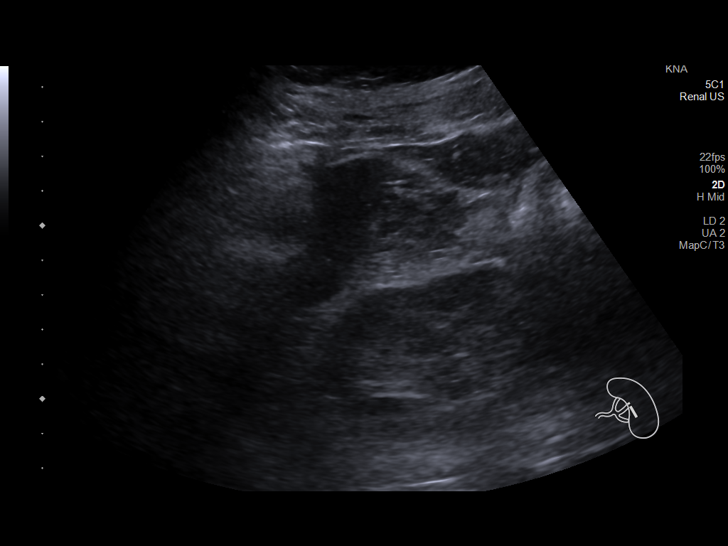
[im 39/72]
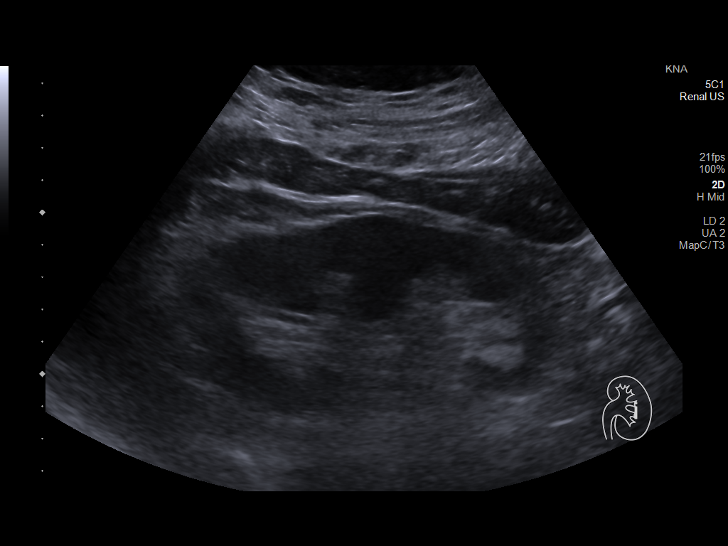
[im 45/72]
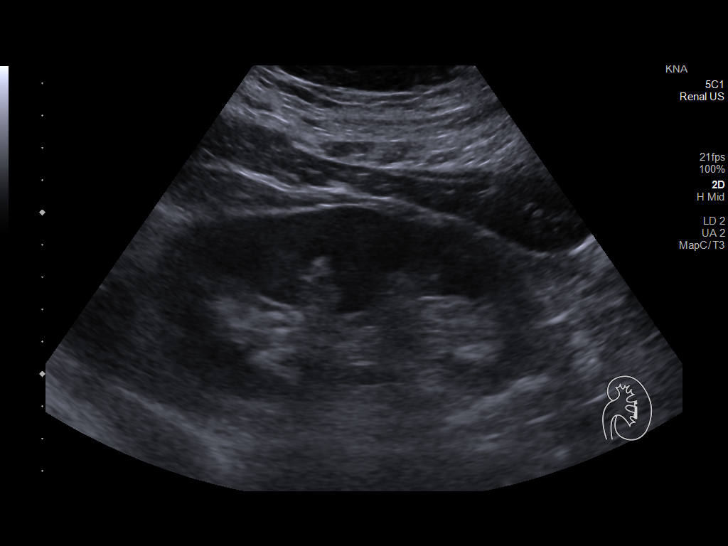
[im 48/72]
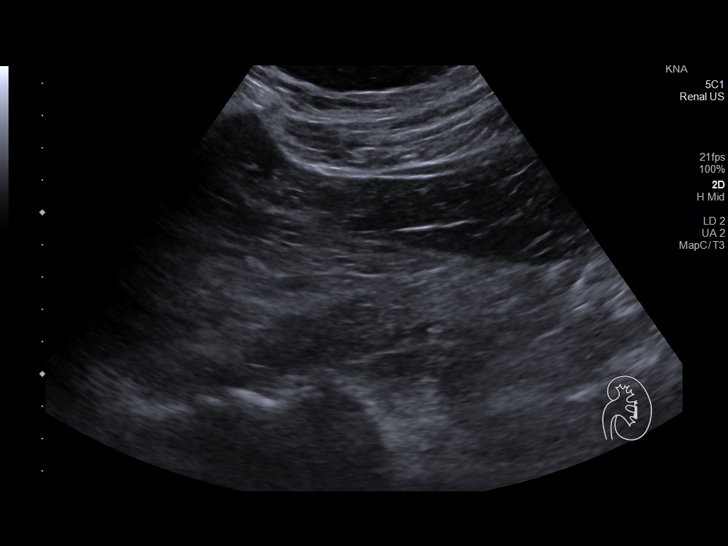
[im 54/72]
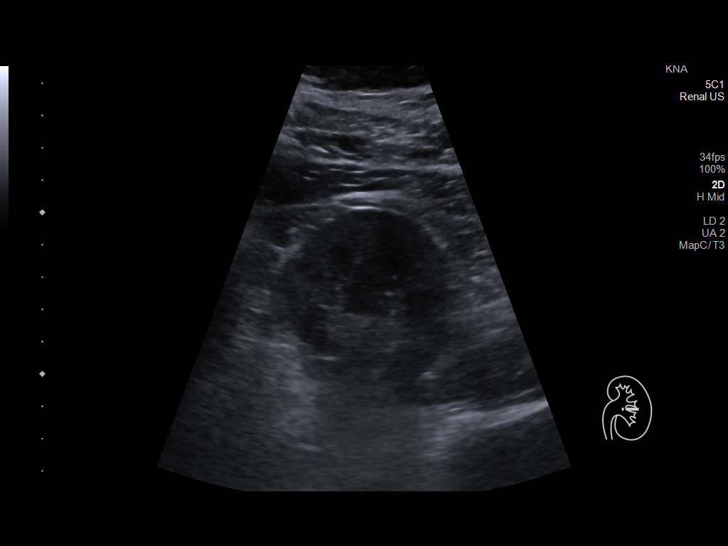
[im 60/72]
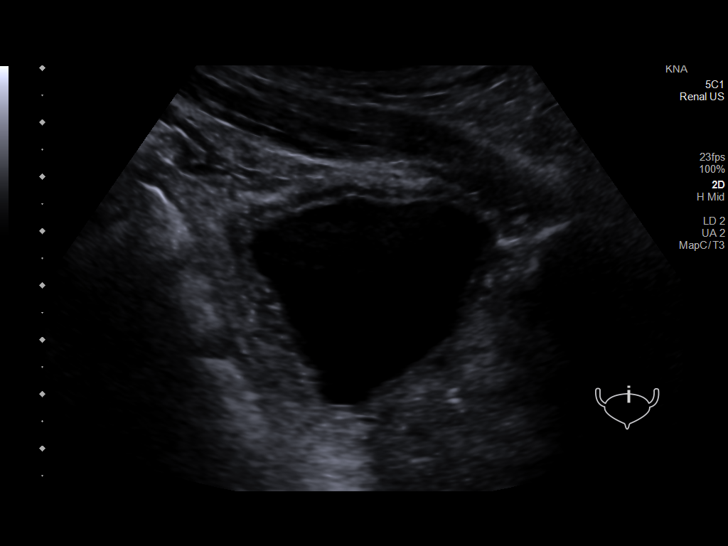
[im 66/72]
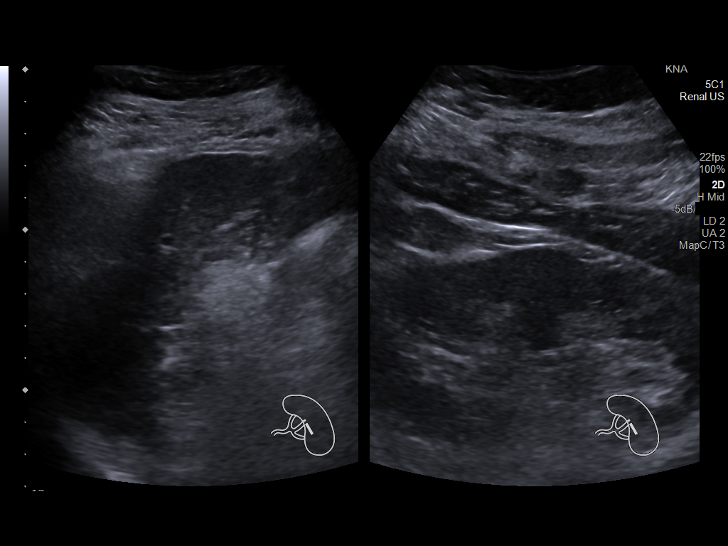
[im 72/72]
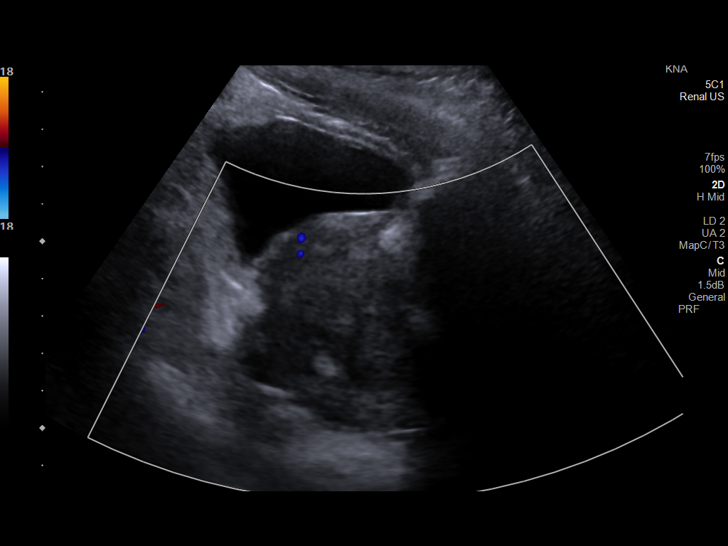

[14 of 25 positions shown; findings below may reference images not displayed]

FINDINGS: Right Kidney:

Renal measurements: 10.8 x 3.9 x 4.1 cm = volume: 90 mL. No
hydronephrosis. Normal renal parenchymal echogenicity and thickness.
Simple 2.6 x 2.1 x 2.5 cm lower right renal cyst.

Left Kidney:

Renal measurements: 12.1 x 6.3 x 5.5 cm = volume: 220 mL.
Echogenicity within normal limits. No mass or hydronephrosis
visualized.

Bladder:

Chronic mild diffuse bladder wall thickening. No focal bladder
abnormality demonstrated. Bilateral ureteral jets seen in the
bladder.

Other:

Enlarged prostate measuring 6.5 x 5.5 x 5.3 cm (100 cc volume).
IMPRESSION: 1. No hydronephrosis.
2. Simple right renal cyst.
3. Chronic mild diffuse bladder wall thickening, probably due to
chronic bladder outlet obstruction by the moderately enlarged
prostate.

## 2022-04-28 DIAGNOSIS — Z23 Encounter for immunization: Secondary | ICD-10-CM | POA: Diagnosis not present

## 2022-07-18 DIAGNOSIS — Z125 Encounter for screening for malignant neoplasm of prostate: Secondary | ICD-10-CM | POA: Diagnosis not present

## 2022-07-18 DIAGNOSIS — Z0000 Encounter for general adult medical examination without abnormal findings: Secondary | ICD-10-CM | POA: Diagnosis not present

## 2022-07-18 DIAGNOSIS — Z1331 Encounter for screening for depression: Secondary | ICD-10-CM | POA: Diagnosis not present

## 2022-07-18 DIAGNOSIS — I10 Essential (primary) hypertension: Secondary | ICD-10-CM | POA: Diagnosis not present

## 2022-07-18 DIAGNOSIS — E782 Mixed hyperlipidemia: Secondary | ICD-10-CM | POA: Diagnosis not present

## 2022-07-18 DIAGNOSIS — E663 Overweight: Secondary | ICD-10-CM | POA: Diagnosis not present

## 2022-09-10 DIAGNOSIS — H524 Presbyopia: Secondary | ICD-10-CM | POA: Diagnosis not present

## 2023-02-09 DIAGNOSIS — E663 Overweight: Secondary | ICD-10-CM | POA: Diagnosis not present

## 2023-02-09 DIAGNOSIS — Z6826 Body mass index (BMI) 26.0-26.9, adult: Secondary | ICD-10-CM | POA: Diagnosis not present

## 2023-02-09 DIAGNOSIS — B029 Zoster without complications: Secondary | ICD-10-CM | POA: Diagnosis not present

## 2023-03-04 DIAGNOSIS — H52222 Regular astigmatism, left eye: Secondary | ICD-10-CM | POA: Diagnosis not present

## 2023-03-04 DIAGNOSIS — H524 Presbyopia: Secondary | ICD-10-CM | POA: Diagnosis not present

## 2023-04-16 DIAGNOSIS — N400 Enlarged prostate without lower urinary tract symptoms: Secondary | ICD-10-CM | POA: Diagnosis not present

## 2023-04-16 DIAGNOSIS — I25119 Atherosclerotic heart disease of native coronary artery with unspecified angina pectoris: Secondary | ICD-10-CM | POA: Diagnosis not present

## 2023-04-16 DIAGNOSIS — I129 Hypertensive chronic kidney disease with stage 1 through stage 4 chronic kidney disease, or unspecified chronic kidney disease: Secondary | ICD-10-CM | POA: Diagnosis not present

## 2023-04-16 DIAGNOSIS — E785 Hyperlipidemia, unspecified: Secondary | ICD-10-CM | POA: Diagnosis not present

## 2023-04-16 DIAGNOSIS — R7303 Prediabetes: Secondary | ICD-10-CM | POA: Diagnosis not present

## 2023-04-16 DIAGNOSIS — N1831 Chronic kidney disease, stage 3a: Secondary | ICD-10-CM | POA: Diagnosis not present

## 2023-04-16 DIAGNOSIS — N529 Male erectile dysfunction, unspecified: Secondary | ICD-10-CM | POA: Diagnosis not present

## 2023-05-08 DIAGNOSIS — Z23 Encounter for immunization: Secondary | ICD-10-CM | POA: Diagnosis not present

## 2023-09-15 DIAGNOSIS — Z23 Encounter for immunization: Secondary | ICD-10-CM | POA: Diagnosis not present

## 2024-01-25 DIAGNOSIS — H524 Presbyopia: Secondary | ICD-10-CM | POA: Diagnosis not present

## 2024-01-29 DIAGNOSIS — I10 Essential (primary) hypertension: Secondary | ICD-10-CM | POA: Diagnosis not present

## 2024-01-29 DIAGNOSIS — Z0000 Encounter for general adult medical examination without abnormal findings: Secondary | ICD-10-CM | POA: Diagnosis not present

## 2024-01-29 DIAGNOSIS — Z1331 Encounter for screening for depression: Secondary | ICD-10-CM | POA: Diagnosis not present

## 2024-01-29 DIAGNOSIS — Z6826 Body mass index (BMI) 26.0-26.9, adult: Secondary | ICD-10-CM | POA: Diagnosis not present

## 2024-01-29 DIAGNOSIS — E782 Mixed hyperlipidemia: Secondary | ICD-10-CM | POA: Diagnosis not present

## 2024-01-29 DIAGNOSIS — E663 Overweight: Secondary | ICD-10-CM | POA: Diagnosis not present

## 2024-03-08 DIAGNOSIS — H40022 Open angle with borderline findings, high risk, left eye: Secondary | ICD-10-CM | POA: Diagnosis not present

## 2024-04-27 DIAGNOSIS — N182 Chronic kidney disease, stage 2 (mild): Secondary | ICD-10-CM | POA: Diagnosis not present

## 2024-04-27 DIAGNOSIS — R972 Elevated prostate specific antigen [PSA]: Secondary | ICD-10-CM | POA: Diagnosis not present
